# Patient Record
Sex: Female | Born: 1970 | Race: White | Hispanic: No | Marital: Married | Smoking: Never smoker
Health system: Southern US, Community
[De-identification: ages and names within clinical notes are randomized; demographics above are authoritative.]

## PROBLEM LIST (undated history)

## (undated) DIAGNOSIS — K589 Irritable bowel syndrome without diarrhea: Secondary | ICD-10-CM

## (undated) DIAGNOSIS — A6920 Lyme disease, unspecified: Secondary | ICD-10-CM

## (undated) HISTORY — DX: Irritable bowel syndrome, unspecified: K58.9

## (undated) HISTORY — DX: Lyme disease, unspecified: A69.20

## (undated) HISTORY — PX: BREAST BIOPSY: SHX20

## (undated) HISTORY — PX: FLEXIBLE SIGMOIDOSCOPY: SHX1649

---

## 2007-05-20 ENCOUNTER — Encounter: Admission: RE | Admit: 2007-05-20 | Discharge: 2007-05-20 | Payer: Self-pay | Admitting: Family Medicine

## 2007-06-30 ENCOUNTER — Ambulatory Visit (HOSPITAL_COMMUNITY): Admission: RE | Admit: 2007-06-30 | Discharge: 2007-06-30 | Payer: Self-pay | Admitting: Obstetrics and Gynecology

## 2007-07-08 ENCOUNTER — Ambulatory Visit (HOSPITAL_COMMUNITY): Admission: RE | Admit: 2007-07-08 | Discharge: 2007-07-08 | Payer: Self-pay | Admitting: Obstetrics and Gynecology

## 2007-07-22 ENCOUNTER — Ambulatory Visit (HOSPITAL_COMMUNITY): Admission: RE | Admit: 2007-07-22 | Discharge: 2007-07-22 | Payer: Self-pay | Admitting: Obstetrics and Gynecology

## 2007-08-19 ENCOUNTER — Ambulatory Visit (HOSPITAL_COMMUNITY): Admission: RE | Admit: 2007-08-19 | Discharge: 2007-08-19 | Payer: Self-pay | Admitting: Obstetrics and Gynecology

## 2007-08-30 ENCOUNTER — Encounter: Admission: RE | Admit: 2007-08-30 | Discharge: 2007-08-30 | Payer: Self-pay | Admitting: Family Medicine

## 2007-09-19 ENCOUNTER — Ambulatory Visit (HOSPITAL_COMMUNITY): Admission: RE | Admit: 2007-09-19 | Discharge: 2007-09-19 | Payer: Self-pay | Admitting: Obstetrics and Gynecology

## 2007-10-17 ENCOUNTER — Ambulatory Visit (HOSPITAL_COMMUNITY): Admission: RE | Admit: 2007-10-17 | Discharge: 2007-10-17 | Payer: Self-pay | Admitting: Obstetrics and Gynecology

## 2007-11-22 ENCOUNTER — Ambulatory Visit (HOSPITAL_COMMUNITY): Admission: RE | Admit: 2007-11-22 | Discharge: 2007-11-22 | Payer: Self-pay | Admitting: Family Medicine

## 2007-12-05 ENCOUNTER — Inpatient Hospital Stay (HOSPITAL_COMMUNITY): Admission: AD | Admit: 2007-12-05 | Discharge: 2007-12-05 | Payer: Self-pay | Admitting: Obstetrics and Gynecology

## 2007-12-06 ENCOUNTER — Inpatient Hospital Stay (HOSPITAL_COMMUNITY): Admission: AD | Admit: 2007-12-06 | Discharge: 2007-12-06 | Payer: Self-pay | Admitting: Obstetrics and Gynecology

## 2007-12-28 ENCOUNTER — Inpatient Hospital Stay (HOSPITAL_COMMUNITY): Admission: AD | Admit: 2007-12-28 | Discharge: 2007-12-30 | Payer: Self-pay | Admitting: Obstetrics & Gynecology

## 2009-02-26 ENCOUNTER — Encounter: Admission: RE | Admit: 2009-02-26 | Discharge: 2009-02-26 | Payer: Self-pay | Admitting: Family Medicine

## 2010-08-15 DIAGNOSIS — K9041 Non-celiac gluten sensitivity: Secondary | ICD-10-CM | POA: Insufficient documentation

## 2010-12-09 LAB — B. BURGDORFI ANTIBODIES: B burgdorferi Ab IgG+IgM: 0.13

## 2010-12-15 LAB — CBC
Hemoglobin: 11.3 — ABNORMAL LOW
MCV: 89.4
Platelets: 156
RDW: 15.2
WBC: 10.5

## 2010-12-15 LAB — RPR: RPR Ser Ql: NONREACTIVE

## 2011-03-23 DIAGNOSIS — Q7649 Other congenital malformations of spine, not associated with scoliosis: Secondary | ICD-10-CM | POA: Insufficient documentation

## 2012-11-16 ENCOUNTER — Other Ambulatory Visit: Payer: Self-pay

## 2012-11-16 DIAGNOSIS — Z1231 Encounter for screening mammogram for malignant neoplasm of breast: Secondary | ICD-10-CM

## 2012-12-07 ENCOUNTER — Ambulatory Visit
Admission: RE | Admit: 2012-12-07 | Discharge: 2012-12-07 | Disposition: A | Discharge status: Home / Self Care | Payer: Self-pay | Source: Ambulatory Visit

## 2012-12-07 DIAGNOSIS — Z1231 Encounter for screening mammogram for malignant neoplasm of breast: Secondary | ICD-10-CM

## 2012-12-13 DIAGNOSIS — K589 Irritable bowel syndrome without diarrhea: Secondary | ICD-10-CM | POA: Insufficient documentation

## 2013-09-29 DIAGNOSIS — IMO0002 Reserved for concepts with insufficient information to code with codable children: Secondary | ICD-10-CM | POA: Insufficient documentation

## 2013-10-12 DIAGNOSIS — N3941 Urge incontinence: Secondary | ICD-10-CM | POA: Insufficient documentation

## 2016-11-02 DIAGNOSIS — B351 Tinea unguium: Secondary | ICD-10-CM | POA: Insufficient documentation

## 2018-07-05 DIAGNOSIS — L603 Nail dystrophy: Secondary | ICD-10-CM | POA: Diagnosis not present

## 2018-07-05 DIAGNOSIS — L219 Seborrheic dermatitis, unspecified: Secondary | ICD-10-CM | POA: Diagnosis not present

## 2018-07-05 DIAGNOSIS — B351 Tinea unguium: Secondary | ICD-10-CM | POA: Diagnosis not present

## 2018-07-05 DIAGNOSIS — L03031 Cellulitis of right toe: Secondary | ICD-10-CM | POA: Diagnosis not present

## 2018-07-26 DIAGNOSIS — L603 Nail dystrophy: Secondary | ICD-10-CM | POA: Diagnosis not present

## 2018-08-29 DIAGNOSIS — L603 Nail dystrophy: Secondary | ICD-10-CM | POA: Diagnosis not present

## 2018-08-29 DIAGNOSIS — R740 Nonspecific elevation of levels of transaminase and lactic acid dehydrogenase [LDH]: Secondary | ICD-10-CM | POA: Diagnosis not present

## 2018-08-29 DIAGNOSIS — L219 Seborrheic dermatitis, unspecified: Secondary | ICD-10-CM | POA: Diagnosis not present

## 2018-09-26 DIAGNOSIS — L603 Nail dystrophy: Secondary | ICD-10-CM | POA: Diagnosis not present

## 2018-09-26 DIAGNOSIS — L03032 Cellulitis of left toe: Secondary | ICD-10-CM | POA: Diagnosis not present

## 2019-03-28 DIAGNOSIS — R300 Dysuria: Secondary | ICD-10-CM | POA: Diagnosis not present

## 2019-03-28 DIAGNOSIS — R350 Frequency of micturition: Secondary | ICD-10-CM | POA: Diagnosis not present

## 2019-06-01 ENCOUNTER — Other Ambulatory Visit: Payer: Self-pay

## 2019-06-01 ENCOUNTER — Telehealth: Payer: Self-pay | Admitting: Osteopathic Medicine

## 2019-06-01 ENCOUNTER — Ambulatory Visit (INDEPENDENT_AMBULATORY_CARE_PROVIDER_SITE_OTHER): Payer: BC Managed Care – PPO | Admitting: Osteopathic Medicine

## 2019-06-01 ENCOUNTER — Ambulatory Visit (INDEPENDENT_AMBULATORY_CARE_PROVIDER_SITE_OTHER): Payer: BC Managed Care – PPO

## 2019-06-01 ENCOUNTER — Encounter: Payer: Self-pay | Admitting: Osteopathic Medicine

## 2019-06-01 VITALS — BP 117/78 | HR 77 | Temp 98.2°F | Ht 64.0 in | Wt 170.1 lb

## 2019-06-01 DIAGNOSIS — Z8619 Personal history of other infectious and parasitic diseases: Secondary | ICD-10-CM

## 2019-06-01 DIAGNOSIS — Z01419 Encounter for gynecological examination (general) (routine) without abnormal findings: Secondary | ICD-10-CM | POA: Diagnosis not present

## 2019-06-01 DIAGNOSIS — G8929 Other chronic pain: Secondary | ICD-10-CM

## 2019-06-01 DIAGNOSIS — M25562 Pain in left knee: Secondary | ICD-10-CM

## 2019-06-01 DIAGNOSIS — L609 Nail disorder, unspecified: Secondary | ICD-10-CM

## 2019-06-01 DIAGNOSIS — M2550 Pain in unspecified joint: Secondary | ICD-10-CM

## 2019-06-01 DIAGNOSIS — Z0000 Encounter for general adult medical examination without abnormal findings: Secondary | ICD-10-CM

## 2019-06-01 NOTE — Telephone Encounter (Signed)
Lipids: Lipid screening Annual physical exam   A1C: Patient requested diagnostic testing (can cancel)  Vitamin D: Can cancel  CRP: Joint pain - should work for this? Not sure why not Can cancel

## 2019-06-01 NOTE — Patient Instructions (Addendum)
Plan: Labs today Will refer to OBGYN for well-woman care  Will refer to Infectious Disease for Lyme consultation Will refer to Sports Medicine for knee pain - will get Xray today. Can see Dr T in our office. I also gave information for Dr. Denyse Amass in [place].

## 2019-06-01 NOTE — Progress Notes (Signed)
Danielle Nelson is a 49 y.o. female who presents to  [facility]  today, 06/02/19, seeking care for the following: . New to establish - concern for persistent Lyme symptoms . Knee pain  . Nail issue on fingers/toes      ASSESSMENT & PLAN with other pertinent history/findings:  The primary encounter diagnosis was History of Lyme disease. Diagnoses of Chronic pain of left knee, Arthralgia, unspecified joint, Well woman exam, and Nail abnormalities were also pertinent to this visit.  1.  History of Lyme disease Per patient, long history of migrating joint pains and fatigue, was eventually diagnosed w/ Lyme around 2004 and reports treatment at that time for a month with likely Doxycycline. Concerned that various current symptoms may be Lyme related or may be something else. She reports migrating joint and lower back pains, urinary issues, nail issues.  Previous labs: 2012 RMSG IgM and IgG (-), Lyme IgM(-) and looks like some other Lyme titers cancelled, repeat Lyme Ab later 2012 (-), no other (+)results on file, records requested.   2. Chronic pain of left knee Referral to sports medicine Xray today Home exercises given Normal exam, she is concerned about a bump on the left knee but I cannot appreciate any obvious asymmetry on exam.   3. Arthralgia, unspecified joint Advised NSAID use as needed Labs as below to w/u other rheumatologic causes Previous labs 2012 (-)ANA, RA, CRP, ASO  4. Well woman exam Referral to OBGYN  5. Nail abnormalities Has seen dermatology and podiatry. Records reviewed. On my exam would agree that likely onychomycosis but she states she was tested for fungus and this was negative. Offered ciclopirox but patient declined. She reports history of infection, was diagnosed with paronychia last year and treated.  See photos below.   6. Thyroid abnormality On records, 2010 hyperthyroid, then hypothyroid, last labs  2017 euthyroid  TPO (-) 2013  7. Vitamin D deficiency Per previous labs as low as 15.5    Patient Instructions  Plan: Labs today Will refer to OBGYN for well-woman care  Will refer to Infectious Disease for Lyme consultation Will refer to Sports Medicine for knee pain - will get Xray today. Can see Dr T in our office. I also gave information for Dr. Georgina Snell in [place].     DG Knee Complete 4 Views Left  Result Date: 06/02/2019 CLINICAL DATA:  Left knee pain EXAM: LEFT KNEE - COMPLETE 4+ VIEW COMPARISON:  None. FINDINGS: No evidence of fracture, dislocation, or joint effusion. No evidence of arthropathy or other focal bone abnormality. Soft tissues are unremarkable. IMPRESSION: Negative. Electronically Signed   By: Misty Stanley M.D.   On: 06/02/2019 08:15     Orders Placed This Encounter  Procedures  . DG Knee Complete 4 Views Left  . Ambulatory referral to Sports Medicine  . Ambulatory referral to Obstetrics / Gynecology  . Ambulatory referral to Infectious Disease    No orders of the defined types were placed in this encounter.      Follow-up instructions: Return in about 2 weeks (around 06/15/2019) for RECHECK LYME AND OTHER ISSUES, GO OVER LABS .                                             BP 117/78 (BP Location: Left Arm, Patient Position: Sitting, Cuff Size: Normal)  Pulse 77   Temp 98.2 F (36.8 C) (Oral)   Ht 5\' 4"  (1.626 m)   Wt 170 lb 1.3 oz (77.1 kg)   LMP 04/29/2019   BMI 29.19 kg/m   No outpatient medications have been marked as taking for the 06/01/19 encounter (Office Visit) with 06/03/19, DO.    No results found for this or any previous visit (from the past 72 hour(s)).  DG Knee Complete 4 Views Left  Result Date: 06/02/2019 CLINICAL DATA:  Left knee pain EXAM: LEFT KNEE - COMPLETE 4+ VIEW COMPARISON:  None. FINDINGS: No evidence of fracture, dislocation, or joint effusion. No evidence of  arthropathy or other focal bone abnormality. Soft tissues are unremarkable. IMPRESSION: Negative. Electronically Signed   By: 06/04/2019 M.D.   On: 06/02/2019 08:15    Depression screen PHQ 2/9 06/01/2019  Decreased Interest 0  Down, Depressed, Hopeless 0  PHQ - 2 Score 0  Altered sleeping 2  Tired, decreased energy 2  Change in appetite 1  Feeling bad or failure about yourself  0  Trouble concentrating 0  Moving slowly or fidgety/restless 0  Suicidal thoughts 0  PHQ-9 Score 5  Difficult doing work/chores Somewhat difficult    GAD 7 : Generalized Anxiety Score 06/01/2019  Nervous, Anxious, on Edge 1  Control/stop worrying 0  Worry too much - different things 1  Trouble relaxing 0  Restless 0  Easily annoyed or irritable 1  Afraid - awful might happen 0  Total GAD 7 Score 3  Anxiety Difficulty Not difficult at all      All questions at time of visit were answered - patient instructed to contact office with any additional concerns or updates.  ER/RTC precautions were reviewed with the patient.  Please note: voice recognition software was used to produce this document, and typos may escape review. Please contact Dr. 06/03/2019 for any needed clarifications.   Total encounter time: 60 minutes.

## 2019-06-01 NOTE — Telephone Encounter (Signed)
Below labs are not covered. I checked in Care Everywhere and she does have a vitamin D Def and elevated LDL. I could not find an elevated glucose. Or a diagnosis for the CRP. Please advise.   Vitamin D Def E55.9 Elevated LDL E78.00  Lipid panel HgbA1C Vitamin D CRP

## 2019-06-01 NOTE — Telephone Encounter (Signed)
Pt was advised by [facility] that there were some labs that weren't covered by the Pts insurance. Can you please review these lab codes?  [id number]

## 2019-06-02 NOTE — Telephone Encounter (Signed)
Left message advising patient

## 2019-06-02 NOTE — Addendum Note (Signed)
Addended by: Chalmers Cater on: 06/02/2019 09:21 AM   Modules accepted: Orders

## 2019-06-02 NOTE — Telephone Encounter (Signed)
Reordered labs based on recommendations.

## 2019-06-09 DIAGNOSIS — Z8619 Personal history of other infectious and parasitic diseases: Secondary | ICD-10-CM | POA: Diagnosis not present

## 2019-06-09 DIAGNOSIS — G8929 Other chronic pain: Secondary | ICD-10-CM | POA: Diagnosis not present

## 2019-06-09 DIAGNOSIS — Z0000 Encounter for general adult medical examination without abnormal findings: Secondary | ICD-10-CM | POA: Diagnosis not present

## 2019-06-09 DIAGNOSIS — M25562 Pain in left knee: Secondary | ICD-10-CM | POA: Diagnosis not present

## 2019-06-12 LAB — RHEUMATOID FACTOR: Rheumatoid fact SerPl-aCnc: <14 IU/mL (ref <14)

## 2019-06-12 LAB — COMPLETE METABOLIC PANEL WITH GFR
AG Ratio: 1.8 (calc) (ref 1.0–2.5)
ALT: 109 U/L — ABNORMAL HIGH (ref 6–29)
AST: 44 U/L — ABNORMAL HIGH (ref 10–35)
Albumin: 4.5 g/dL (ref 3.6–5.1)
Alkaline phosphatase (APISO): 91 U/L (ref 31–125)
BUN: 10 mg/dL (ref 7–25)
CO2: 29 mmol/L (ref 20–32)
Calcium: 9.8 mg/dL (ref 8.6–10.2)
Chloride: 103 mmol/L (ref 98–110)
Creat: 0.66 mg/dL (ref 0.50–1.10)
GFR, Est African American: 123 mL/min/{1.73_m2} (ref >=60)
GFR, Est Non African American: 106 mL/min/{1.73_m2} (ref >=60)
Globulin: 2.5 g/dL (calc) (ref 1.9–3.7)
Glucose, Bld: 72 mg/dL (ref 65–99)
Potassium: 4.3 mmol/L (ref 3.5–5.3)
Sodium: 141 mmol/L (ref 135–146)
Total Bilirubin: 0.3 mg/dL (ref 0.2–1.2)
Total Protein: 7 g/dL (ref 6.1–8.1)

## 2019-06-12 LAB — CBC WITH DIFFERENTIAL/PLATELET
Absolute Monocytes: 440 cells/uL (ref 200–950)
Basophils Absolute: 28 cells/uL (ref 0–200)
Basophils Relative: 0.4 %
Eosinophils Absolute: 149 cells/uL (ref 15–500)
Eosinophils Relative: 2.1 %
HCT: 40.2 % (ref 35.0–45.0)
Hemoglobin: 13.2 g/dL (ref 11.7–15.5)
Lymphs Abs: 2286 cells/uL (ref 850–3900)
MCH: 28.6 pg (ref 27.0–33.0)
MCHC: 32.8 g/dL (ref 32.0–36.0)
MCV: 87.2 fL (ref 80.0–100.0)
MPV: 12.3 fL (ref 7.5–12.5)
Monocytes Relative: 6.2 %
Neutro Abs: 4196 cells/uL (ref 1500–7800)
Neutrophils Relative %: 59.1 %
Platelets: 228 10*3/uL (ref 140–400)
RBC: 4.61 10*6/uL (ref 3.80–5.10)
RDW: 14.4 % (ref 11.0–15.0)
Total Lymphocyte: 32.2 %
WBC: 7.1 10*3/uL (ref 3.8–10.8)

## 2019-06-12 LAB — SEDIMENTATION RATE: Sed Rate: 31 mm/h — ABNORMAL HIGH (ref 0–20)

## 2019-06-12 LAB — TSH: TSH: 1.21 mIU/L

## 2019-06-12 LAB — B. BURGDORFI ANTIBODIES: B burgdorferi Ab IgG+IgM: <0.9 index

## 2019-06-12 LAB — HIGH SENSITIVITY CRP: hs-CRP: >10 mg/L — ABNORMAL HIGH

## 2019-06-12 LAB — CK: Total CK: 74 U/L (ref 29–143)

## 2019-06-15 ENCOUNTER — Other Ambulatory Visit: Payer: Self-pay

## 2019-06-15 ENCOUNTER — Ambulatory Visit (INDEPENDENT_AMBULATORY_CARE_PROVIDER_SITE_OTHER): Payer: BC Managed Care – PPO

## 2019-06-15 ENCOUNTER — Ambulatory Visit (INDEPENDENT_AMBULATORY_CARE_PROVIDER_SITE_OTHER): Payer: BC Managed Care – PPO | Admitting: Osteopathic Medicine

## 2019-06-15 ENCOUNTER — Encounter: Payer: Self-pay | Admitting: Osteopathic Medicine

## 2019-06-15 VITALS — BP 120/79 | HR 76 | Temp 98.1°F | Wt 168.0 lb

## 2019-06-15 DIAGNOSIS — Z0000 Encounter for general adult medical examination without abnormal findings: Secondary | ICD-10-CM

## 2019-06-15 DIAGNOSIS — R748 Abnormal levels of other serum enzymes: Secondary | ICD-10-CM | POA: Diagnosis not present

## 2019-06-15 DIAGNOSIS — Z13220 Encounter for screening for lipoid disorders: Secondary | ICD-10-CM | POA: Diagnosis not present

## 2019-06-15 DIAGNOSIS — Z1211 Encounter for screening for malignant neoplasm of colon: Secondary | ICD-10-CM | POA: Diagnosis not present

## 2019-06-15 DIAGNOSIS — K7689 Other specified diseases of liver: Secondary | ICD-10-CM | POA: Diagnosis not present

## 2019-06-15 NOTE — Patient Instructions (Signed)
General Preventive Care  Most recent routine screening labs: need lipids (cholesterol)   Everyone should have blood pressure checked once per year. Goal 130/80 or less.   Tobacco: don't!   Alcohol: responsible moderation is ok for most adults - if you have concerns about your alcohol intake, please talk to me!   Exercise: as tolerated to reduce risk of cardiovascular disease and diabetes. Strength training will also prevent osteoporosis.   Mental health: if need for mental health care (medicines, counseling, other), or concerns about moods, please let me know!   Sexual health: if need for STD testing, or if concerns with libido/pain problems, please let me know! If you need to discuss your birth control options, please let me know!   Advanced Directive: Living Will and/or Healthcare Power of Attorney recommended for all adults, regardless of age or health.  Vaccines  Flu vaccine: for almost everyone, every fall.   Shingles vaccine: after age 5.   Pneumonia vaccines: after age 70  Tetanus booster: every 10 years  COVID vaccine: thanks for taking the vaccine!  Cancer screenings   Colon cancer screening: for everyone age 73-75. Colonoscopy available for all, many people also qualify for the Cologuard stool test   Breast cancer screening: mammogram at age 60 every other year at least, and annually after age 49.   Cervical cancer screening: Pap every 1 to 5 years depending on age and other risk factors. Can usually stop at age 44 or w/ hysterectomy.   Lung cancer screening: not needed for non-smokers  Infection screenings  . HIV: recommended screening at least once age 71-65 . Gonorrhea/Chlamydia: screening as needed . Hepatitis C: recommended once for everyone age 48-75 . TB: certain at-risk populations, or depending on work requirements and/or travel history Other . Bone Density Test: recommended for women at age 101

## 2019-06-15 NOTE — Progress Notes (Signed)
HPI: Danielle Nelson is a 49 y.o. female who  has a past medical history of Lyme disease.  she presents to [facility] today, 06/16/19,  for chief complaint of: Annual Physical Review labs - transaminitis   Upcoming appts w/ GYN, ID, Sports med Continued migrating joint pains  Trnasaminitis, most recent previous records 2017 show normal AST/ALT    Past medical, surgical, social and family history reviewed:  There are no problems to display for this patient.   No past surgical history on file.  Social History   Tobacco Use  . Smoking status: Never Smoker  . Smokeless tobacco: Never Used  Substance Use Topics  . Alcohol use: Not Currently    Family History  Problem Relation Age of Onset  . High blood pressure Mother   . Parkinson's disease Father   . Stroke Maternal Grandmother      Current medication list and allergy/intolerance information reviewed:    No current outpatient medications on file.   No current facility-administered medications for this visit.    No Known Allergies    Review of Systems:  Constitutional:  No  fever, no chills  HEENT: No  headache, no vision change  Cardiac: No  chest pain, No  pressure  Respiratory:  No  shortness of breath. No  Cough  Gastrointestinal: No  abdominal pain  Musculoskeletal: + myalgia/arthralgia  Skin: No  Rash, No other wounds/concerning lesions  Neurologic: No  weakness, No  dizziness  Psychiatric: No  concerns with depression, No  concerns with anxiety  Exam:  BP 120/79 (BP Location: Left Arm, Patient Position: Sitting, Cuff Size: Normal)   Pulse 76   Temp 98.1 F (36.7 C) (Oral)   Wt 168 lb 0.6 oz (76.2 kg)   BMI 28.84 kg/m   Constitutional: VS see above. General Appearance: alert, well-developed, well-nourished, NAD  Eyes: Normal lids and conjunctive, non-icteric sclera  Neck: No masses, trachea midline  Respiratory: Normal respiratory effort. no wheeze,  no rhonchi, no rales  Cardiovascular: S1/S2 normal, no murmur, no rub/gallop auscultated. RRR.   Musculoskeletal: Gait normal. No clubbing/cyanosis of digits.   Neurological: Normal balance/coordination. No tremor.  Skin: warm, dry, intact. No rash/ulcer. N  Psychiatric: Normal judgment/insight. Normal mood and affect. Oriented x3.         ASSESSMENT/PLAN: The primary encounter diagnosis was Annual physical exam. Diagnoses of Elevated liver enzymes, Lipid screening, and Colon cancer screening were also pertinent to this visit.   Orders Placed This Encounter  Procedures  . US ABDOMEN LIMITED RUQ  . Lipid panel  . Ferritin  . Gamma GT  . Hepatitis B surface antigen  . Hepatitis C antibody  . Iron and TIBC  . CP4508-PT/INR AND PTT  . Hepatic function panel    No orders of the defined types were placed in this encounter.   Patient Instructions  General Preventive Care  Most recent routine screening labs: need lipids (cholesterol)   Everyone should have blood pressure checked once per year. Goal 130/80 or less.   Tobacco: don't!   Alcohol: responsible moderation is ok for most adults - if you have concerns about your alcohol intake, please talk to me!   Exercise: as tolerated to reduce risk of cardiovascular disease and diabetes. Strength training will also prevent osteoporosis.   Mental health: if need for mental health care (medicines, counseling, other), or concerns about moods, please let me know!   Sexual health: if need for STD testing, or if  concerns with libido/pain problems, please let me know! If you need to discuss your birth control options, please let me know!   Advanced Directive: Living Will and/or Healthcare Power of Attorney recommended for all adults, regardless of age or health.  Vaccines  Flu vaccine: for almost everyone, every fall.   Shingles vaccine: after age 22.   Pneumonia vaccines: after age 64  Tetanus booster: every 10  years  COVID vaccine: thanks for taking the vaccine!  Cancer screenings   Colon cancer screening: for everyone age 51-75. Colonoscopy available for all, many people also qualify for the Cologuard stool test   Breast cancer screening: mammogram at age 76 every other year at least, and annually after age 62.   Cervical cancer screening: Pap every 1 to 5 years depending on age and other risk factors. Can usually stop at age 48 or w/ hysterectomy.   Lung cancer screening: not needed for non-smokers  Infection screenings  . HIV: recommended screening at least once age 26-65 . Gonorrhea/Chlamydia: screening as needed . Hepatitis C: recommended once for everyone age 79-75 . TB: certain at-risk populations, or depending on work requirements and/or travel history Other . Bone Density Test: recommended for women at age 57       Visit summary with medication list and pertinent instructions was printed for patient to review. All questions at time of visit were answered - patient instructed to contact office with any additional concerns or updates. ER/RTC precautions were reviewed with the patient.     Please note: voice recognition software was used to produce this document, and typos may escape review. Please contact Dr. Lyn Hollingshead for any needed clarifications.     Follow-up plan: Return in about 1 year (around 06/14/2020) for [place] (call week prior to visit for lab orders) / sooner as needed, depending on results .

## 2019-06-16 LAB — LIPID PANEL
Cholesterol: 238 mg/dL — ABNORMAL HIGH (ref <200)
HDL: 47 mg/dL — ABNORMAL LOW (ref >=50)
LDL Cholesterol (Calc): 146 mg/dL (calc) — ABNORMAL HIGH
Non-HDL Cholesterol (Calc): 191 mg/dL (calc) — ABNORMAL HIGH (ref <130)
Total CHOL/HDL Ratio: 5.1 (calc) — ABNORMAL HIGH (ref <5.0)
Triglycerides: 294 mg/dL — ABNORMAL HIGH (ref <150)

## 2019-06-16 LAB — HEPATIC FUNCTION PANEL
AG Ratio: 1.7 (calc) (ref 1.0–2.5)
ALT: 99 U/L — ABNORMAL HIGH (ref 6–29)
AST: 51 U/L — ABNORMAL HIGH (ref 10–35)
Albumin: 4.3 g/dL (ref 3.6–5.1)
Alkaline phosphatase (APISO): 84 U/L (ref 31–125)
Bilirubin, Direct: 0.1 mg/dL (ref 0.0–0.2)
Globulin: 2.6 g/dL (calc) (ref 1.9–3.7)
Indirect Bilirubin: 0.3 mg/dL (calc) (ref 0.2–1.2)
Total Bilirubin: 0.4 mg/dL (ref 0.2–1.2)
Total Protein: 6.9 g/dL (ref 6.1–8.1)

## 2019-06-16 LAB — CP4508-PT/INR AND PTT
INR: 1
Prothrombin Time: 10.2 s (ref 9.0–11.5)
aPTT: 23 s (ref 23–32)

## 2019-06-16 LAB — HEPATITIS B SURFACE ANTIGEN: Hepatitis B Surface Ag: NONREACTIVE

## 2019-06-16 LAB — FERRITIN: Ferritin: 46 ng/mL (ref 16–232)

## 2019-06-16 LAB — HEPATITIS C ANTIBODY
Hepatitis C Ab: NONREACTIVE
SIGNAL TO CUT-OFF: 0.01 (ref <1.00)

## 2019-06-16 LAB — GAMMA GT: GGT: 120 U/L — ABNORMAL HIGH (ref 3–55)

## 2019-06-19 ENCOUNTER — Ambulatory Visit: Payer: BC Managed Care – PPO | Admitting: Family Medicine

## 2019-06-19 ENCOUNTER — Other Ambulatory Visit: Payer: Self-pay

## 2019-06-19 ENCOUNTER — Ambulatory Visit (INDEPENDENT_AMBULATORY_CARE_PROVIDER_SITE_OTHER): Payer: BC Managed Care – PPO

## 2019-06-19 ENCOUNTER — Encounter: Payer: Self-pay | Admitting: Family Medicine

## 2019-06-19 VITALS — BP 120/84 | HR 100 | Ht 64.0 in | Wt 165.0 lb

## 2019-06-19 DIAGNOSIS — M25562 Pain in left knee: Secondary | ICD-10-CM

## 2019-06-19 DIAGNOSIS — Z8619 Personal history of other infectious and parasitic diseases: Secondary | ICD-10-CM | POA: Insufficient documentation

## 2019-06-19 NOTE — Progress Notes (Signed)
Subjective:   I, Danielle Nelson, am serving as a scribe for Dr. Lynne Leader.  I'm seeing this patient as a consultation for: Danielle Reeve, DO . Note will be routed back to referring provider/PCP.  CC: Chronic pain of L knee  HPI: Patient is a 49 year old female presenting with chronic pain of L knee X 4 month . She rates pain as 3-4/10 and describes pain as throbbing in nature. Patient locates pain to lateral knee . Patient had an X-ray 06/01/2019 that did not show anything. 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  Pain is mostly anterior knee.  She does note a little bump on the superior anterior-lateral aspect of the knee.  She has this is somewhat painful.  Radiating pain: no L LE numbness/tingling: no L LE weakness: no Aggravating factors: walking or overuse  Treatments tried: given home exercises motrin, ice  Past medical history, Surgical history, Family history, Social history, Allergies, and medications have been entered into the medical record, reviewed.   Review of Systems: No new headache, visual changes, nausea, vomiting, diarrhea, constipation, dizziness, abdominal pain, skin rash, fevers, chills, night sweats, weight loss, swollen lymph nodes, body aches, joint swelling, muscle aches, chest pain, shortness of breath, mood changes, visual or auditory hallucinations.   Objective:    Vitals:   06/19/19 1320  BP: 120/84  Pulse: 100  SpO2: 99%   General: Well Developed, well nourished, and in no acute distress.  Neuro/Psych: Alert and oriented x3, extra-ocular muscles intact, able to move all 4 extremities, sensation grossly intact. Skin: Warm and dry, no rashes noted.  Respiratory: Not using accessory muscles, speaking in full sentences, trachea midline.  Cardiovascular: Pulses palpable, no extremity edema. Abdomen: Does not appear distended. MSK: Left knee: Small bony prominence superior lateral patella.  Minimally tender.     Decreased VMO bulk.  Otherwise normal-appearing Range of motion 0-120 degrees without significant crepitation. Tender palpation mildly medial and lateral knee and it bony prominence as above. Stable ligamentous exam. Intact strength. Negative McMurray's test.  Lab and Radiology Results  EXAM: LEFT KNEE - COMPLETE 4+ VIEW  COMPARISON:  None.  FINDINGS: No evidence of fracture, dislocation, or joint effusion. No evidence of arthropathy or other focal bone abnormality. Soft tissues are unremarkable.  IMPRESSION: Negative.   Electronically Signed   By: Misty Stanley M.D.   On: 06/02/2019 08:15  I, Lynne Leader, personally (independently) visualized and performed the interpretation of the images attached in this note.  Diagnostic Limited MSK Ultrasound of: Left knee Quad tendon intact normal-appearing Lateral aspect of quad tendon insertion of bony prominence appears to be spur formation on ultrasound.  No increased vascular activity in this area. Patellar tendon margin normal-appearing slight hypoechoic fluid tracking both superficial and deep to patellar tendon distally. Medial lateral joint line largely normal-appearing slight degenerative appearance No Baker's cyst Impression: Slight prepatellar bursitis.  Spur formation at quad tendon insertion superior lateral patella   Impression and Recommendations:    Assessment and Plan: 49 y.o. female with left knee pain.  Patellofemoral pain and mild prepatellar bursitis most likely explanation for pain.  Plan for trial of physical therapy as well as Voltaren gel.  Recheck back in about 6 weeks.  Return sooner if needed.  Precautions reviewed.  PDMP not reviewed this encounter. Orders Placed This Encounter  Procedures  . [place] LIMITED JOINT SPACE STRUCTURES LOW LEFT    Standing Status:   Future  Number of Occurrences:   1    Standing Expiration Date:   08/18/2020    Order Specific Question:   Reason for Exam (SYMPTOM  OR  DIAGNOSIS REQUIRED)    Answer:   Left knee pain    Order Specific Question:   Preferred imaging location?    Answer:   Adult nurse [facility]  . Ambulatory referral to Physical Therapy    Referral Priority:   Routine    Referral Type:   Physical Medicine    Referral Reason:   Specialty Services Required    Requested Specialty:   Physical Therapy   No orders of the defined types were placed in this encounter.   Discussed warning signs or symptoms. Please see discharge instructions. Patient expresses understanding.   The above documentation has been reviewed and is accurate and complete Clementeen Graham

## 2019-06-19 NOTE — Patient Instructions (Signed)
Thank you for coming in today.  Attend PT.  Use over the counter voltaren gel up to 4x daily.  Recheck with me in 6 weeks or sooner if needed.  If not doing well please let me know sooner.   Patellofemoral Pain Syndrome  Patellofemoral pain syndrome is a condition in which the tissue (cartilage) on the underside of the kneecap (patella) softens or breaks down. This causes pain in the front of the knee. The condition is also called runner's knee or chondromalacia patella. Patellofemoral pain syndrome is most common in young adults who are active in sports. The knee is the largest joint in the body. The patella covers the front of the knee and is attached to muscles above and below the knee. The underside of the patella is covered with a smooth type of cartilage (synovium). The smooth surface helps the patella to glide easily when you move your knee. Patellofemoral pain syndrome causes swelling in the joint linings and bone surfaces in the knee. What are the causes? This condition may be caused by:  Overuse of the knee.  Poor alignment of your knee joints.  Weak leg muscles.  A direct blow to your kneecap. What increases the risk? You are more likely to develop this condition if:  You do a lot of activities that can wear down your kneecap. These include: ? Running. ? Squatting. ? Climbing stairs.  You start a new physical activity or exercise program.  You wear shoes that do not fit well.  You do not have good leg strength.  You are overweight. What are the signs or symptoms? The main symptom of this condition is knee pain. This may feel like a dull, aching pain underneath your patella, in the front of your knee. There may be a popping or cracking sound when you move your knee. Pain may get worse with:  Exercise.  Climbing stairs.  Running.  Jumping.  Squatting.  Kneeling.  Sitting for a long time.  Moving or pushing on your patella. How is this diagnosed? This  condition may be diagnosed based on:  Your symptoms and medical history. You may be asked about your recent physical activities and which ones cause knee pain.  A physical exam. This may include: ? Moving your patella back and forth. ? Checking your range of knee motion. ? Having you squat or jump to see if you have pain. ? Checking the strength of your leg muscles.  Imaging tests to confirm the diagnosis. These may include an MRI of your knee. How is this treated? This condition may be treated at home with rest, ice, compression, and elevation (RICE).  Other treatments may include:  Nonsteroidal anti-inflammatory drugs (NSAIDs).  Physical therapy to stretch and strengthen your leg muscles.  Shoe inserts (orthotics) to take stress off your knee.  A knee brace or knee support.  Adhesive tapes to the skin.  Surgery to remove damaged cartilage or move the patella to a better position. This is rare. Follow these instructions at home: If you have a shoe or brace:  Wear the shoe or brace as told by your health care provider. Remove it only as told by your health care provider.  Loosen the shoe or brace if your toes tingle, become numb, or turn cold and blue.  Keep the shoe or brace clean.  If the shoe or brace is not waterproof: ? Do not let it get wet. ? Cover it with a watertight covering when you take a  bath or a shower. Managing pain, stiffness, and swelling  If directed, put ice on the painful area. ? If you have a removable shoe or brace, remove it as told by your health care provider. ? Put ice in a plastic bag. ? Place a towel between your skin and the bag. ? Leave the ice on for 20 minutes, 2-3 times a day.  Move your toes often to avoid stiffness and to lessen swelling.  Rest your knee: ? Avoid activities that cause knee pain. ? When sitting or lying down, raise (elevate) the injured area above the level of your heart, whenever possible. General  instructions  Take over-the-counter and prescription medicines only as told by your health care provider.  Use splints, braces, knee supports, or walking aids as directed by your health care provider.  Perform stretching and strengthening exercises as told by your health care provider or physical therapist.  Do not use any products that contain nicotine or tobacco, such as cigarettes and e-cigarettes. These can delay healing. If you need help quitting, ask your health care provider.  Return to your normal activities as told by your health care provider. Ask your health care provider what activities are safe for you.  Keep all follow-up visits as told by your health care provider. This is important. Contact a health care provider if:  Your symptoms get worse.  You are not improving with home care. Summary  Patellofemoral pain syndrome is a condition in which the tissue (cartilage) on the underside of the kneecap (patella) softens or breaks down.  This condition causes swelling in the joint linings and bone surfaces in the knee. This leads to pain in the front of the knee.  This condition may be treated at home with rest, ice, compression, and elevation (RICE).  Use splints, braces, knee supports, or walking aids as directed by your health care provider. This information is not intended to replace advice given to you by your health care provider. Make sure you discuss any questions you have with your health care provider. Document Revised: 04/12/2017 Document Reviewed: 04/12/2017 Elsevier Patient Education  2020 Elsevier Inc.    Prepatellar Bursitis  Prepatellar bursitis is inflammation of the prepatellar bursa, which is a fluid-filled sac that cushions the kneecap (patella). Prepatellar bursitis happens when fluid builds up in this sac and causes it to swell. The condition causes knee pain. What are the causes? This condition may be caused by:  Constant pressure on the knees  from kneeling.  A hit to the knee.  Falling on the knee.  Infection from bacteria.  Moving the knee often in a forceful way. What increases the risk? You are more likely to develop this condition if:  You play sports that have a high risk of falling on the knee or being hit on the knee. These include football, wrestling, basketball, or soccer.  You do work in which you kneel for long periods of time, such as roofing, plumbing, or gardening.  You have another inflammatory condition, such as gout or rheumatoid arthritis. What are the signs or symptoms? The most common symptom of this condition is knee pain that gets better with rest. Other symptoms include:  Swelling on the front of the kneecap.  Warmth in the knee.  Tenderness with activity.  Redness in the knee.  Inability to bend the knee or to kneel. How is this diagnosed? This condition is diagnosed based on:  A physical exam. Your health care provider will compare your  knees and check for tenderness and pain while moving your knee.  Your medical history.  Tests to check for infection. These may include blood tests and tests on the fluid in the bursa.  Imaging tests, such as X-ray, MRI, or ultrasound, to check for damage in the patella, or fluid buildup and swelling in the bursa. How is this treated? This condition may be treated by:  Resting the knee.  Putting ice on the knee.  Taking medicines, such as: ? NSAIDs. These can help to reduce pain and swelling. ? Antibiotics. These may be needed if you have an infection. ? Steroids. These are used to reduce swelling and inflammation, and may be prescribed if other treatments are not helping.  Raising (elevating) the knee while resting.  Doing exercises to help you maintain movement (physical therapy). These may be recommended after pain and swelling improve.  Having a procedure to remove fluid from the bursa. This may be done if other treatments are not  helping.  Having surgery to remove the bursa. This may be done if you have a severe infection or if the condition keeps coming back after treatment. Follow these instructions at home: Medicines  Take over-the-counter and prescription medicines only as told by your health care provider.  If you were prescribed an antibiotic medicine, take it as told by your health care provider. Do not stop taking the antibiotic even if you start to feel better. Managing pain, stiffness, and swelling   If directed, put ice on the injured area. ? Put ice in a plastic bag. ? Place a towel between your skin and the bag. ? Leave the ice on for 20 minutes, 2-3 times a day.  Elevate the injured area above the level of your heart while you are sitting or lying down. Activity  Do not use the injured limb to support your body weight until your health care provider says that you can.  Rest your knee.  Avoid activities that cause knee pain.  Return to your normal activities as told by your health care provider. Ask your health care provider what activities are safe for you.  Do exercises as told by your health care provider. General instructions  Ask your health care provider when it is safe for you to drive.  Do not use any products that contain nicotine or tobacco, such as cigarettes, e-cigarettes, and chewing tobacco. These can delay healing. If you need help quitting, ask your health care provider.  Keep all follow-up visits as told by your health care provider. This is important. How is this prevented?  Warm up and stretch before being active.  Cool down and stretch after being active.  Give your body time to rest between periods of activity.  Maintain physical fitness, including strength and flexibility.  Be safe and responsible while being active. This will help you to avoid falls.  Wear knee pads if you have to kneel for a long period of time. Contact a health care provider if:  Your  symptoms do not improve or get worse.  Your symptoms keep coming back after treatment.  You develop a fever and have warmth, redness, or swelling over your knee. Summary  Prepatellar bursitis is inflammation of the prepatellar bursa, which is a fluid-filled sac that cushions the kneecap (patella).  This condition may be caused by injury or constant pressure on the knee. It may also be caused by an infection from bacteria.  Symptoms of this condition include pain, swelling, warmth, and  tenderness in the knee.  Follow instructions from your health care provider about taking medicines, resting, and doing activities.  Contact your health care provider if your symptoms do not improve, get worse, or keep coming back after treatment. This information is not intended to replace advice given to you by your health care provider. Make sure you discuss any questions you have with your health care provider. Document Revised: 06/24/2018 Document Reviewed: 05/12/2018 Elsevier Patient Education  2020 [employer].

## 2019-06-20 ENCOUNTER — Ambulatory Visit: Payer: BC Managed Care – PPO | Admitting: Internal Medicine

## 2019-06-20 ENCOUNTER — Other Ambulatory Visit: Payer: Self-pay

## 2019-06-20 ENCOUNTER — Encounter: Payer: Self-pay | Admitting: Internal Medicine

## 2019-06-20 DIAGNOSIS — M2550 Pain in unspecified joint: Secondary | ICD-10-CM | POA: Diagnosis not present

## 2019-06-20 NOTE — Addendum Note (Signed)
Addended by: Deirdre Pippins on: 06/20/2019 04:57 PM   Modules accepted: Orders

## 2019-06-20 NOTE — Progress Notes (Signed)
[facility]      Reason for Consult: myalgias and arthralgias    Referring Physician: Dr. Lyn Hollingshead    Patient ID: Danielle Nelson, female    DOB: 1970-05-08, 49 y.o.   MRN: [id number]  HPI:   She comes here for evaluation of chronic arthralgias, myalgias and gastrointestinal symptoms that she has had since her initial diagnosis of Lyme disease in about 2003.  She was living in the [place] area and did have some tick bites including 1 erythema migrans rash and subsequently developed some symptoms including some left arm paresthesias and after approximately 1 year of symptoms, was diagnosed with serologic positive Lyme disease.  Records of that are unavailable but she was treated initially with 2 weeks of doxycycline then had a longer course with another 2 weeks.  She also in 2008 had serologic tests regarding Lyme and other tick related coinfections including Babesia, ehrlichiosis and a repeat Lyme disease tests and all tests were were negative except for a minimally positive IgG of ehrlichiosis.  She does not remember any issues with leukopenia or thrombocytopenia that were noted.  Recent labs also done by her primary care physician show negative Lyme disease titer and other work-up including rheumatologic diseases all are within normal limits.  She has been living with her symptoms for years but wanted to reestablish with physicians including infectious diseases to see if there is any new insight into post Lyme syndrome or what is otherwise called chronic Lyme disease. Previous record reviewed and negative recent Lyme and previous negative tests from 2008.    Past Medical History:  Diagnosis Date  . Lyme disease     Prior to Admission medications   Medication Sig Start Date End Date Taking? Authorizing Provider  Multiple Vitamin (MULTI-VITAMIN) tablet Take by mouth.   Yes [provider]  ibuprofen (ADVIL) 200 MG tablet Take by mouth.    [provider]    No Known Allergies  Social History   Tobacco Use  . Smoking status: Never Smoker  . Smokeless tobacco: Never Used  Substance Use Topics  . Alcohol use: Not Currently  . Drug use: Never    Family History  Problem Relation Age of Onset  . High blood pressure Mother   . Parkinson's disease Father   . Stroke Maternal Grandmother     Review of Systems  Constitutional: negative for fevers, chills, anorexia and weight loss Gastrointestinal: positive for diarrhea and bloating, negative for nausea Hematologic/lymphatic: negative for lymphadenopathy Musculoskeletal: positive for myalgias and arthralgias All other systems reviewed and are negative    Constitutional: in no apparent distress  Vitals:   06/20/19 0914  BP: 133/85  Pulse: 84  Temp: 98.5 F (36.9 C)  SpO2: 98%   EYES: anicteric Musculoskeletal: no pedal edema noted, no joint swelling Skin: negatives: no rash Neuro: non-focal  Labs: Lab Results  Component Value Date   WBC 7.1 06/09/2019   HGB 13.2 06/09/2019   HCT 40.2 06/09/2019   MCV 87.2 06/09/2019   PLT 228 06/09/2019    Lab Results  Component Value Date   CREATININE 0.66 06/09/2019   BUN 10 06/09/2019   NA 141 06/09/2019   K 4.3 06/09/2019   CL 103 06/09/2019   CO2 29 06/09/2019    Lab Results  Component Value Date   ALT 99 (H) 06/15/2019   AST 51 (H) 06/15/2019   BILITOT 0.4 06/15/2019   INR 1.0 06/15/2019  Assessment: Post Lyme syndrome.  She gives a history of Lyme disease in the [place] area where there is some endemicity with a clear erythema migrans rash following a tick bite in the appropriate season.  She also endorses positive serology though, those results are not available to me.  This seems consistent with a history of Lyme disease and she did receive appropriate treatment.  I discussed with her what is known of a post Lyme syndrome which is essentially no known treatment.  What we do know is there is no  role known at this time for long-term antibiotics or other known treatments.  I did mention ongoing studies going on with [place] in [place] though I did not give her any particular information regarding this.  There are research studies looking at cognition as well as investigation of medications but nothing that has been approved.  Therefore from an infectious disease standpoint, there is no particular options known at this time.  She could reach out to [employer] ([place]) for further information to see if she would be appropriate for any study.  Plan: 1) supportive care

## 2019-06-21 ENCOUNTER — Encounter: Payer: Self-pay | Admitting: Gastroenterology

## 2019-06-22 ENCOUNTER — Ambulatory Visit: Payer: BC Managed Care – PPO | Admitting: Nurse Practitioner

## 2019-07-03 ENCOUNTER — Encounter: Payer: BC Managed Care – PPO | Admitting: Obstetrics & Gynecology

## 2019-07-04 ENCOUNTER — Encounter: Payer: Self-pay | Admitting: Rehabilitative and Restorative Service Providers"

## 2019-07-04 ENCOUNTER — Ambulatory Visit (INDEPENDENT_AMBULATORY_CARE_PROVIDER_SITE_OTHER): Payer: BC Managed Care – PPO | Admitting: Rehabilitative and Restorative Service Providers"

## 2019-07-04 ENCOUNTER — Other Ambulatory Visit: Payer: Self-pay

## 2019-07-04 DIAGNOSIS — M25562 Pain in left knee: Secondary | ICD-10-CM | POA: Diagnosis not present

## 2019-07-04 DIAGNOSIS — M6281 Muscle weakness (generalized): Secondary | ICD-10-CM | POA: Diagnosis not present

## 2019-07-04 DIAGNOSIS — G8929 Other chronic pain: Secondary | ICD-10-CM

## 2019-07-04 NOTE — Patient Instructions (Signed)
Access Code: [email or web address]: [email or web address]: 04/20/2021Prepared by: Karston Hyland HoltExercises  Hooklying Hamstring Stretch with Strap - 2 x daily - 7 x weekly - 3 reps - 1 sets - 30 sec hold  Prone Quadriceps Stretch with Strap - 2 x daily - 7 x weekly - 1 sets - 3 reps - 30 sec hold Patient Education  Kinesiology tape

## 2019-07-04 NOTE — Therapy (Signed)
[facility] [place] [address] [place], [place], [address] Phone: [phone]   Fax:  [phone]  Physical Therapy Evaluation  Patient Details  Name: Danielle Nelson MRN: [id number] Date of Birth: 1970/11/05 Referring Provider (PT): Dr Clementeen Graham     Encounter Date: 07/04/2019  PT End of Session - 07/04/19 1055    Visit Number  1    Number of Visits  12    Date for PT Re-Evaluation  08/15/19    PT Start Time  0811   patient late for appointment   PT Stop Time  0848    PT Time Calculation (min)  37 min    Activity Tolerance  Patient tolerated treatment well       Past Medical History:  Diagnosis Date  . Lyme disease     History reviewed. No pertinent surgical history.  There were no vitals filed for this visit.   Subjective Assessment - 07/04/19 0811    Subjective  Patient reports that she had Lt knee pain starting 18 years ago and was diagnosed with "shifted knee cap" which was treated conservatively. She has had some intermittent pain lasting a couple of days and resolved. In the past few months the pain has increased with walking.    Pertinent History  multiple joint pain following Lyme's disease 2003 - continues to have constant joint pain    Patient Stated Goals  get rid of pain in the knee    Currently in Pain?  Yes    Pain Score  1     Pain Location  Knee    Pain Orientation  Left    Pain Descriptors / Indicators  Aching    Pain Type  Chronic pain    Pain Radiating Towards  into calf and lateral leg    Pain Onset  More than a month ago    Pain Frequency  Intermittent    Aggravating Factors   walking; stairs; ADL's    Pain Relieving Factors  avoiding activities that irritate knee; ice; elevation; motrin         [facility] PT Assessment - 07/04/19 0001      Assessment   Medical Diagnosis  Lt knee pain; patellofemoral pain     Referring Provider (PT)  Dr Clementeen Graham      Onset Date/Surgical Date  01/15/20     Hand Dominance  Right    Next MD Visit  PRN     Prior Therapy  18 years ago       Precautions   Precautions  None      Balance Screen   Has the patient fallen in the past 6 months  No    Has the patient had a decrease in activity level because of a fear of falling?   No    Is the patient reluctant to leave their home because of a fear of falling?   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 chores; school for children; volunteering at church; PTA       Observation/Other Assessments   Observations  girth Lt LE decreased compared to Rt at thigh     Focus on Therapeutic Outcomes (FOTO)   56% limitation       Sensation   Additional Comments  WFL's per pt report  Posture/Postural Control   Posture Comments  wearing ~ 2 inch heels       AROM   Right/Left Hip  --   ROM equal and WFL's bilat    Right/Left Knee  --   ROM equal and WFL's bilat      Strength   Overall Strength Comments  strength is functional bilat LE''s    Right/Left Hip  --   not tested resistively    Right Knee Flexion  5/5    Right Knee Extension  5/5    Left Knee Flexion  5/5    Left Knee Extension  5/5    Right/Left Ankle  --   WFL's bilat      Flexibility   Hamstrings  tight Lt > Rt     Quadriceps  tight Lt > Rt    ITB  WFL's     Piriformis  tight Lt > Rt       Palpation   Patella mobility  decreased medial glide Lt patella     Palpation comment  Lt LE - tightness and banding lateral quad at insertion to patella; tenderness lateral quad and calf, anterior tib       Balance   Balance Assessed  --   SLS ~ 10 sec each LE - more difficult Lt compared to Rt                Objective measurements completed on examination: See above findings.      [facility] Adult PT Treatment/Exercise - 07/04/19 0001      Knee/Hip Exercises: Stretches   Passive Hamstring Stretch  Left;2  reps;30 seconds   supine with strap    Quad Stretch  Left;2 reps;30 seconds   prone with strap      Manual Therapy   Kinesiotex  --   patellofemoral correction             PT Education - 07/04/19 0848    Education Details  HEP taping    Person(s) Educated  Patient    Methods  Explanation;Demonstration;Tactile cues;Verbal cues;Handout    Comprehension  Verbalized understanding;Returned demonstration;Verbal cues required;Tactile cues required          PT Long Term Goals - 07/04/19 1103      PT LONG TERM GOAL #1   Title  Increase LE functional strength with patient to report ability and stand and walk for 15-20 min with minimal to no increase in pain.    Time  6    Period  Weeks    Status  New    Target Date  08/15/19      PT LONG TERM GOAL #2   Title  Decrease pain by 50-75% allowing patient to progress with ADL's    Time  6    Period  Weeks    Status  New    Target Date  08/15/19      PT LONG TERM GOAL #3   Title  Patient to demonstrate increased tissue extensibility in hamstrings and piriformis musculature with mobility equal bilat    Time  6    Period  Weeks    Status  New    Target Date  08/15/19      PT LONG TERM GOAL #4   Title  Independent in HEP    Time  6    Period  Weeks    Status  New    Target Date  08/15/19      PT LONG TERM  GOAL #5   Title  Improve FOTO to </= 37% limitation    Time  6    Period  Weeks    Status  New    Target Date  08/15/19             Plan - 07/04/19 1056    Clinical Impression Statement  Patient presents with recurrent Lt knee pain over the past 18 years with most recent flare up over the past 6 months. Patient has poor posture and alignment(in ~ 2 inch heels today); muscular tightness to palpation through the lateral quad into leg; poor patellar alignment; limited functional activity level due to pain; pain on a daily basis. She has constant joint pain due to Lyme's disease ~ 18 years ago which may infulence  knee rehab and limit patient's ability to perform exercises for stretching and strengthening. Patient will benefit from PT to address problems identified.    Stability/Clinical Decision Making  Stable/Uncomplicated    Clinical Decision Making  Low    Rehab Potential  Good    PT Frequency  2x / week    PT Treatment/Interventions  Patient/family education;ADLs/Self Care Home Management;Cryotherapy;Electrical Stimulation;Iontophoresis 4mg /ml Dexamethasone;Moist Heat;Ultrasound;Therapeutic activities;Therapeutic exercise;Balance training;Neuromuscular re-education;Dry needling;Taping       Patient will benefit from skilled therapeutic intervention in order to improve the following deficits and impairments:     Visit Diagnosis: Chronic pain of left knee - Plan: PT plan of care cert/re-cert  Muscle weakness (generalized) - Plan: PT plan of care cert/re-cert     Problem List Patient Active Problem List   Diagnosis Date Noted  . Arthralgia 06/20/2019  . H/o Lyme disease 06/19/2019  . Onychomycosis due to dermatophyte 11/02/2016  . Urge incontinence 10/12/2013  . Bladder cystocele 09/29/2013  . Irritable bowel syndrome (IBS) 12/13/2012  . Perimenopausal symptoms 12/13/2012  . 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 Left cervical radiculopathy 03/18/2011  . Gluten intolerance 08/15/2010  . Allergic rhinitis 06/17/2010  . Rosacea 05/06/2007    Aziyah Provencal Nilda Simmer PT, MPH  07/04/2019, 11:10 AM  [facility] [address] [place], [place], [address] Phone: [phone]   Fax:  [phone]  Name: Danielle Nelson MRN: [id number] Date of Birth: 1970-07-31

## 2019-07-07 ENCOUNTER — Encounter: Payer: Self-pay | Admitting: Physical Therapy

## 2019-07-07 ENCOUNTER — Ambulatory Visit: Payer: BC Managed Care – PPO | Admitting: Physical Therapy

## 2019-07-07 ENCOUNTER — Other Ambulatory Visit: Payer: Self-pay

## 2019-07-07 DIAGNOSIS — M6281 Muscle weakness (generalized): Secondary | ICD-10-CM

## 2019-07-07 DIAGNOSIS — G8929 Other chronic pain: Secondary | ICD-10-CM

## 2019-07-07 DIAGNOSIS — M25562 Pain in left knee: Secondary | ICD-10-CM | POA: Diagnosis not present

## 2019-07-07 NOTE — Patient Instructions (Signed)
Access Code: [email or web address]: [email or web address]: 04/23/2021Prepared by: [facility] [place]  Hooklying Hamstring Stretch with Strap - 2 x daily - 7 x weekly - 2-3 reps - 1 sets - 30 sec hold  Supine ITB Stretch with Strap - 1 x daily - 7 x weekly - 3 sets - 2-3 reps - 30 hold  Prone Quadriceps Stretch with Strap - 2 x daily - 7 x weekly - 1 sets - 2-3 reps - 30 sec hold  Supine Active Straight Leg Raise - 1 x daily - 7 x weekly - 2 sets - 10 reps  Supine Bridge - 1 x daily - 7 x weekly - 2 sets - 10 reps  Squat with Chair Touch - 1 x daily - 7 x weekly - 1 sets - 10 reps  Roller Massage Elongated IT Band Release - 1 x daily - 7 x weekly - 1 sets

## 2019-07-07 NOTE — Therapy (Signed)
[facility] [place] [address] [place], [place], [address] Phone: [phone]   Fax:  [phone]  Physical Therapy Treatment  Patient Details  Name: Danielle Nelson MRN: [id number] Date of Birth: 08-25-1970 Referring Provider (PT): Dr Clementeen Graham     Encounter Date: 07/07/2019  PT End of Session - 07/07/19 1020    Visit Number  2    Number of Visits  12    Date for PT Re-Evaluation  08/15/19    PT Start Time  0935    PT Stop Time  1020    PT Time Calculation (min)  45 min       Past Medical History:  Diagnosis Date  . Lyme disease     History reviewed. No pertinent surgical history.  There were no vitals filed for this visit.  Subjective Assessment - 07/07/19 0941    Subjective  Pt reports that Lt knee pain increases during ADL's and walking short distances. The pain continues when she is resting.    Currently in Pain?  No/denies    Pain Score  0-No pain    Pain Onset  More than a month ago    Pain Frequency  Intermittent    Aggravating Factors   walking, ADL's    Pain Relieving Factors  taping         [facility] PT Assessment - 07/07/19 0001      Assessment   Medical Diagnosis  Lt knee pain; patellofemoral pain     Referring Provider (PT)  Dr Clementeen Graham      Onset Date/Surgical Date  01/15/20    Hand Dominance  Right    Next MD Visit  PRN     Prior Therapy  18 years ago       [facility] Adult PT Treatment/Exercise - 07/07/19 0001      Self-Care   Self-Care  Other Self-Care Comments      Other Self-Care Comments   pt ed. on self massage to lateral and distal Lt knee to decrease pain and tightness; pt returned demo with cues     Knee/Hip Exercises: Stretches   Passive Hamstring Stretch  Left;2 reps;20 seconds   supine with strap    Quad Stretch  Left;3 reps;20 seconds   prone with strap, noodle above knee    ITB Stretch  Left;2 reps;20 seconds   supine with strap     Knee/Hip Exercises: Aerobic   Recumbent  Bike  L1, 4 mins   pt c/o Rt knee discomfort, discontinued     Knee/Hip Exercises: Standing   Lateral Step Up  1 set;5 reps;Step Height: 4";Hand Hold: 1;Right   heel taps Lt; discontinued,  increase in Rt knee discomfort   Step Down  Right;Step Height: 4";Hand Hold: 2;10 reps;1 set   toe taps with Lt, pt tolerated better than lateral   Functional Squat  2 sets;10 reps   to raised high/low table, pt tolerate well.      Knee/Hip Exercises: Supine   Short Arc Quad Sets  1 set;Left;10 reps;Strengthening   3 sec hold in ext   Bridges  1 set;10 reps   Pelvic tilts  after 5 reps to increase comfort   Straight Leg Raises  Strengthening;Left;1 set;10 reps   3 sec hold     Manual Therapy   Kinesiotex  Inhibit Muscle   3" dynamic tape to lat Lt knee in K shape for patellar stability       PT Education -  07/07/19 1136    Education Details  Updated HEP, self massage Lt knee/ ITB (manual and roller stick)    Person(s) Educated  Patient    Methods  Handout;Explanation    Comprehension  Verbalized understanding;Returned demonstration          PT Long Term Goals - 07/07/19 1131      PT LONG TERM GOAL #1   Title  Increase LE functional strength with patient to report ability and stand and walk for 15-20 min with minimal to no increase in pain.    Time  6    Period  Weeks    Status  New      PT LONG TERM GOAL #2   Title  Decrease pain by 50-75% allowing patient to progress with ADL's    Time  6    Period  Weeks    Status  New      PT LONG TERM GOAL #3   Title  Patient to demonstrate increased tissue extensibility in hamstrings and piriformis musculature with mobility equal bilat    Time  6    Period  Weeks    Status  New      PT LONG TERM GOAL #4   Title  Independent in HEP    Time  6    Period  Weeks    Status  New      PT LONG TERM GOAL #5   Title  Improve FOTO to </= 37% limitation    Time  6    Period  Weeks    Status  New            Plan - 07/07/19 1131     Clinical Impression Statement  Pt reported increase in Lt knee discomfort with bike, heel taps; shortened reps or discontinued. She tolerated ITB and hamstring stretches well; added ITB to HEP per pt request. Lateral steps on stairs were less tolerable than front steps. Squats to a high table were tolerable with no increase in Lt knee pain. She stated that bridges helped to ease LBP that started with the knee pain. Positive response to Ktape application; reapplied but with dynamic tape instead of Rock tape. Progressing towards goals.    Rehab Potential  Good    PT Frequency  2x / week    PT Treatment/Interventions  Patient/family education;ADLs/Self Care Home Management;Cryotherapy;Electrical Stimulation;Iontophoresis 4mg /ml Dexamethasone;Moist Heat;Ultrasound;Therapeutic activities;Therapeutic exercise;Balance training;Neuromuscular re-education;Dry needling;Taping    PT Next Visit Plan  stairs to tolerance, ITB manual work, bridges. low back care with stretched for knee.       Patient will benefit from skilled therapeutic intervention in order to improve the following deficits and impairments:     Visit Diagnosis: Chronic pain of left knee  Muscle weakness (generalized)     Problem List Patient Active Problem List   Diagnosis Date Noted  . Arthralgia 06/20/2019  . H/o Lyme disease 06/19/2019  . Onychomycosis due to dermatophyte 11/02/2016  . Urge incontinence 10/12/2013  . Bladder cystocele 09/29/2013  . Irritable bowel syndrome (IBS) 12/13/2012  . Perimenopausal symptoms 12/13/2012  . 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 Left cervical radiculopathy 03/18/2011  . Gluten intolerance 08/15/2010  . Allergic rhinitis 06/17/2010  . Rosacea 05/06/2007   Ronaldo Miyamoto, SPTA 07/07/19 1:29 PM  This entire session was performed under direct supervision and direction of a licensed Physical Brewing technologist. I have personally read, edited and approved of the note as written.  Kerin Perna, PTA 07/07/19 1:38 PM;  [facility] [address] [place], [place], [address] Phone: [phone]   Fax:  [phone]  Name: Danielle Nelson MRN: [id number] Date of Birth: 1971/01/04

## 2019-07-11 ENCOUNTER — Encounter: Payer: Self-pay | Admitting: Rehabilitative and Restorative Service Providers"

## 2019-07-11 ENCOUNTER — Ambulatory Visit (INDEPENDENT_AMBULATORY_CARE_PROVIDER_SITE_OTHER): Payer: BC Managed Care – PPO | Admitting: Rehabilitative and Restorative Service Providers"

## 2019-07-11 ENCOUNTER — Other Ambulatory Visit: Payer: Self-pay

## 2019-07-11 DIAGNOSIS — M25562 Pain in left knee: Secondary | ICD-10-CM

## 2019-07-11 DIAGNOSIS — G8929 Other chronic pain: Secondary | ICD-10-CM

## 2019-07-11 DIAGNOSIS — M6281 Muscle weakness (generalized): Secondary | ICD-10-CM | POA: Diagnosis not present

## 2019-07-11 NOTE — Patient Instructions (Signed)
Access Code: [email or web address]: [email or web address]: 04/27/2021Prepared by: Karlene Southard HoltExercises  Gastroc Stretch on Wall - 2 x daily - 7 x weekly - 3 reps - 1 sets - 30 sec hold  Standing Soleus Stretch - 2 x daily - 7 x weekly - 3 reps - 1 sets - 30 sec hold  Seated Hamstring Stretch - 2 x daily - 7 x weekly - 3 reps - 1 sets - 30 sec hold

## 2019-07-11 NOTE — Therapy (Signed)
[facility] [place] [address] [place], [place], [address] Phone: [phone]   Fax:  [phone]  Physical Therapy Treatment  Patient Details  Name: Danielle Nelson MRN: [id number] Date of Birth: Jun 30, 1970 Referring Provider (PT): Dr Clementeen Graham     Encounter Date: 07/11/2019  PT End of Session - 07/11/19 0809    Visit Number  3    Number of Visits  12    Date for PT Re-Evaluation  08/15/19    PT Start Time  0807    PT Stop Time  0853    PT Time Calculation (min)  46 min    Activity Tolerance  Patient tolerated treatment well       Past Medical History:  Diagnosis Date  . Lyme disease     History reviewed. No pertinent surgical history.  There were no vitals filed for this visit.  Subjective Assessment - 07/11/19 0810    Subjective  Patient reports that she has some stiffness in the knee today and feels that it is difficult to stretch her knee out straight. Some of the exercises are OK and others are hard    Currently in Pain?  Yes    Pain Score  4     Pain Location  Knee    Pain Descriptors / Indicators  Aching;Tightness                       [facility] Adult PT Treatment/Exercise - 07/11/19 0001      Knee/Hip Exercises: Stretches   Passive Hamstring Stretch  Left;2 reps;20 seconds   supine with strap    Passive Hamstring Stretch Limitations  seated HS stretch 30 sec x 2 reps Lt     Quad Stretch  Left;3 reps;20 seconds   prone with strap    ITB Stretch  Left;2 reps;20 seconds   supine with strap   ITB Stretch Limitations  PT assisted ITB stretch w/pt sidelying Lt LE off edge of table    Gastroc Stretch  Left;2 reps;30 seconds    Soleus Stretch  Left;2 reps;30 seconds      Knee/Hip Exercises: Aerobic   Nustep  L5 x 5 min UE 9       Knee/Hip Exercises: Supine   Straight Leg Raises  Strengthening;Left;1 set;10 reps   3 sec hold - suggested slight ER w/ SLR - pause as needed    Straight Leg Raises  Limitations  pt reports some discomfort end of set       Manual Therapy   Manual therapy comments  pt supine and Rt sidelying     Soft tissue mobilization  deep tissue work IASTM and manual work through the FTL/ITB/lateral thigh/lateral quad/lateral calf     Myofascial Release  lateral thigh     Passive ROM  manual work through the lateral thigh in stretch position for ITB     Kinesiotex  Inhibit Muscle   dynamic tape to lat Lt knee; patellar stability            PT Education - 07/11/19 0847    Education Details  HEP    Person(s) Educated  Patient    Methods  Explanation;Demonstration;Tactile cues;Verbal cues;Handout    Comprehension  Verbalized understanding;Returned demonstration;Verbal cues required;Tactile cues required          PT Long Term Goals - 07/07/19 1131      PT LONG TERM GOAL #1   Title  Increase LE functional strength with  patient to report ability and stand and walk for 15-20 min with minimal to no increase in pain.    Time  6    Period  Weeks    Status  New      PT LONG TERM GOAL #2   Title  Decrease pain by 50-75% allowing patient to progress with ADL's    Time  6    Period  Weeks    Status  New      PT LONG TERM GOAL #3   Title  Patient to demonstrate increased tissue extensibility in hamstrings and piriformis musculature with mobility equal bilat    Time  6    Period  Weeks    Status  New      PT LONG TERM GOAL #4   Title  Independent in HEP    Time  6    Period  Weeks    Status  New      PT LONG TERM GOAL #5   Title  Improve FOTO to </= 37% limitation    Time  6    Period  Weeks    Status  New            Plan - 07/11/19 0815    Clinical Impression Statement  Continued pain in the Lt knee - but some improvement since beginning therapy and exercise. Will add stretches in the am to address morning stiffness. Added gastroc and soleus stretches. Continued with stretches and strengthening. Repeated taping which was helpful. Added  manual work lateral thigh to lateral knee to lateral calf with good response - decreased palpable tightness and pt reporting improved mobility.    Rehab Potential  Good    PT Frequency  2x / week    PT Duration  6 weeks    PT Treatment/Interventions  Patient/family education;ADLs/Self Care Home Management;Cryotherapy;Electrical Stimulation;Iontophoresis 4mg /ml Dexamethasone;Moist Heat;Ultrasound;Therapeutic activities;Therapeutic exercise;Balance training;Neuromuscular re-education;Dry needling;Taping    PT Next Visit Plan  stretching and strengthening Lt LE; manual work and taping as indicated; assess response to manual work    PT [place]    Consulted and Agree with Plan of Care  Patient       Patient will benefit from skilled therapeutic intervention in order to improve the following deficits and impairments:     Visit Diagnosis: Chronic pain of left knee  Muscle weakness (generalized)     Problem List Patient Active Problem List   Diagnosis Date Noted  . Arthralgia 06/20/2019  . H/o Lyme disease 06/19/2019  . Onychomycosis due to dermatophyte 11/02/2016  . Urge incontinence 10/12/2013  . Bladder cystocele 09/29/2013  . Irritable bowel syndrome (IBS) 12/13/2012  . Perimenopausal symptoms 12/13/2012  . 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 Left cervical radiculopathy 03/18/2011  . Gluten intolerance 08/15/2010  . Allergic rhinitis 06/17/2010  . Rosacea 05/06/2007    Brolin Dambrosia Nilda Simmer PT, MPH  07/11/2019, 9:07 AM  [facility] [place], [place], [address] Phone: [phone]   Fax:  [phone]  Name: Cecille Mcclusky MRN: [id number] Date of Birth: [birth year]

## 2019-07-13 ENCOUNTER — Ambulatory Visit (INDEPENDENT_AMBULATORY_CARE_PROVIDER_SITE_OTHER): Payer: BC Managed Care – PPO | Admitting: Physical Therapy

## 2019-07-13 ENCOUNTER — Encounter: Payer: Self-pay | Admitting: Physical Therapy

## 2019-07-13 ENCOUNTER — Other Ambulatory Visit: Payer: Self-pay

## 2019-07-13 DIAGNOSIS — G8929 Other chronic pain: Secondary | ICD-10-CM | POA: Diagnosis not present

## 2019-07-13 DIAGNOSIS — M6281 Muscle weakness (generalized): Secondary | ICD-10-CM

## 2019-07-13 DIAGNOSIS — M25562 Pain in left knee: Secondary | ICD-10-CM

## 2019-07-13 NOTE — Patient Instructions (Addendum)
Access Code: [email or web address] URL: [email or web address]: 04/29/2021Prepared by: [facility] [place]Exercises  Standing Soleus Stretch - 2 x daily - 7 x weekly - 1 sets - 2 reps - 30 seconds hold  Gastroc Stretch on Wall - 2 x daily - 7 x weekly - 1 sets - 2 reps - 30 seconds hold  Seated Hamstring Stretch - 2 x daily - 7 x weekly - 1 sets - 2-3 reps - 30 seconds hold  Hooklying Hamstring Stretch with Strap - 2 x daily - 7 x weekly - 2-3 reps - 1 sets - 30 sec hold  Supine ITB Stretch with Strap - 1 x daily - 7 x weekly - 3 sets - 2-3 reps - 30 hold  Prone Quadriceps Stretch with Strap - 2 x daily - 7 x weekly - 1 sets - 2-3 reps - 30 sec hold  Supine Active Straight Leg Raise - 1 x daily - 7 x weekly - 2 sets - 10 reps  Supine Bridge - 1 x daily - 7 x weekly - 2 sets - 10 reps  Squat with Chair Touch - 1 x daily - 7 x weekly - 1 sets - 10 reps  Roller Massage Elongated IT Band Release - 1 x daily - 7 x weekly - 1 sets

## 2019-07-13 NOTE — Therapy (Signed)
[place], [place], [address] Phone: [phone]   Fax:  [phone]  Physical Therapy Treatment  Patient Details  Name: Danielle Nelson MRN: [id number] Date of Birth: November 03, 1970 Referring Provider (PT): Dr Lynne Leader     Encounter Date: 07/13/2019  PT End of Session - 07/13/19 0805    Visit Number  4    Number of Visits  12    Date for PT Re-Evaluation  08/15/19    PT Start Time  0803    PT Stop Time  4742    PT Time Calculation (min)  44 min       Past Medical History:  Diagnosis Date  . Lyme disease     History reviewed. No pertinent surgical history.  There were no vitals filed for this visit.  Subjective Assessment - 07/13/19 0806    Subjective  Pt has a full weekened of standing and moving ahead of her. She also states that she is going to try to start walking around a paved track at her sons soccer field. Pt reported that HEP is going well and she has been compliant with all exercises.    Pertinent History  multiple joint pain following Lyme's disease 2003 - continues to have constant joint pain    Currently in Pain?  No/denies    Pain Score  0-No pain    Pain Location  Knee    Pain Orientation  Left    Pain Onset  More than a month ago    Aggravating Factors   standing, ADL's, pushing exercises    Pain Relieving Factors  taping        [facility] Adult PT Treatment/Exercise - 07/13/19 0001      Knee/Hip Exercises: Stretches   Quad Stretch  Right;Left;1 rep;2 reps;30 seconds   prone with strap, noodle above knee  Trial standing quad stretch in standing with foot supported on raised table; unable to hold foot.    Quad Stretch Limitations  Rt 1, Lt 2; pt ed. on standing quad stretch x1, 10 sec    Gastroc Stretch  Right;Left;1 rep;30 seconds    Soleus Stretch  Right;Left;1 rep;30 seconds    Other Knee/Hip Stretches  half happy baby, Rt, Lt, 1 rep, 15 seconds   supine with strap      Knee/Hip Exercises: Aerobic   Nustep  L5 x 4 min UE 9    mild increase in Lt knee discomfort.      Knee/Hip Exercises: Standing   Wall Squat  1 set;10 reps;10 seconds    SLS  10, 20 & 30 sec; blue airex; LLE   progressed from DLS to SLS Lt, increase 10 sec interval   Other Standing Knee Exercises  laps between exercises to prevent stiffness x 1    Other Standing Knee Exercises  mini pulse lunges; 10 each LE, 2 sec up/down; min VC's needed for set up   in front of mirror to promote self correction of form     Knee/Hip Exercises: Supine   Quad Sets  Left;1 set;10 reps   5 sec hold   Short Arc Quad Sets  1 set;Left;10 reps;Strengthening   3 sec hold   Straight Leg Raises  Strengthening;Left;2 sets;5 reps   5 sec hold   Straight Leg Raises Limitations  no pain reported with splitting the 10 reps into 2 sets        PT Education - 07/13/19 0828    Education  Details  HEP - consolidated from previous visit.    Person(s) Educated  Patient    Methods  Explanation    Comprehension  Verbalized understanding         PT Long Term Goals - 07/13/19 0857      PT LONG TERM GOAL #1   Title  Increase LE functional strength with patient to report ability and stand and walk for 15-20 min with minimal to no increase in pain.    Time  6    Period  Weeks    Status  New      PT LONG TERM GOAL #2   Title  Decrease pain by 50-75% allowing patient to progress with ADL's    Time  6    Period  Weeks    Status  New      PT LONG TERM GOAL #3   Title  Patient to demonstrate increased tissue extensibility in hamstrings and piriformis musculature with mobility equal bilat    Time  6    Period  Weeks    Status  New      PT LONG TERM GOAL #4   Title  Independent in HEP    Time  6    Period  Weeks    Status  New      PT LONG TERM GOAL #5   Title  Improve FOTO to </= 37% limitation    Time  6    Period  Weeks    Status  New        Plan - 07/13/19 0831    Clinical Impression Statement   Lt knee pain continues with prolonged standing and walking, however stairs are no longer painful.   Stronger emphasis on strengthening exercises today; pt tolerated well with minimal to no increase pain.  4/10 pain reported with NuStep.  Unable to complete standing quad stretch; will remain in supine for HEP. Goals are on-going.    Rehab Potential  Good    PT Frequency  2x / week    PT Duration  6 weeks    PT Treatment/Interventions  Patient/family education;ADLs/Self Care Home Management;Cryotherapy;Electrical Stimulation;Iontophoresis 4mg /ml Dexamethasone;Moist Heat;Ultrasound;Therapeutic activities;Therapeutic exercise;Balance training;Neuromuscular re-education;Dry needling;Taping    PT Next Visit Plan  stretching and strengthening Lt LE; manual work and taping as indicated; assess response to manual work; SLS and balance LLE    PT Home Exercise Plan  Access Code: [email or web address]    Consulted and Agree with Plan of Care  Patient       Patient will benefit from skilled therapeutic intervention in order to improve the following deficits and impairments:     Visit Diagnosis: Chronic pain of left knee  Muscle weakness (generalized)     Problem List Patient Active Problem List   Diagnosis Date Noted  . Arthralgia 06/20/2019  . H/o Lyme disease 06/19/2019  . Onychomycosis due to dermatophyte 11/02/2016  . Urge incontinence 10/12/2013  . Bladder cystocele 09/29/2013  . Irritable bowel syndrome (IBS) 12/13/2012  . Perimenopausal symptoms 12/13/2012  . 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 Left cervical radiculopathy 03/18/2011  . Gluten intolerance 08/15/2010  . Allergic rhinitis 06/17/2010  . Rosacea 05/06/2007   This entire session was performed under direct supervision and direction of a licensed Physical 05/08/2007. I have personally read, edited and approved of the note as written.  Environmental health practitioner, PTA 07/13/19 11:29 AM  07/15/19, SPTA 07/13/19 11:29 AM     [facility] [address] [place]  [address] [place], [place], [address] Phone: [phone]   Fax:  [phone]  Name: Danielle Nelson MRN: [id number] Date of Birth: 06/08/1970

## 2019-07-17 ENCOUNTER — Other Ambulatory Visit: Payer: Self-pay

## 2019-07-17 ENCOUNTER — Encounter: Payer: Self-pay | Admitting: Physical Therapy

## 2019-07-17 ENCOUNTER — Ambulatory Visit (INDEPENDENT_AMBULATORY_CARE_PROVIDER_SITE_OTHER): Payer: BC Managed Care – PPO | Admitting: Physical Therapy

## 2019-07-17 DIAGNOSIS — M6281 Muscle weakness (generalized): Secondary | ICD-10-CM | POA: Diagnosis not present

## 2019-07-17 DIAGNOSIS — M25562 Pain in left knee: Secondary | ICD-10-CM | POA: Diagnosis not present

## 2019-07-17 DIAGNOSIS — G8929 Other chronic pain: Secondary | ICD-10-CM

## 2019-07-17 NOTE — Therapy (Signed)
[place], [place], [address] Phone: [phone]   Fax:  [phone]  Physical Therapy Treatment  Patient Details  Name: Danielle Nelson MRN: [id number] Date of Birth: 1971/01/20 Referring Provider (PT): Dr Lynne Leader     Encounter Date: 07/17/2019  PT End of Session - 07/17/19 0809    Visit Number  5    Number of Visits  12    Date for PT Re-Evaluation  08/15/19    PT Start Time  0805    PT Stop Time  0851    PT Time Calculation (min)  46 min  (ice last 10 min)      Past Medical History:  Diagnosis Date  . Lyme disease     History reviewed. No pertinent surgical history.  There were no vitals filed for this visit.  Subjective Assessment - 07/17/19 0809    Subjective  She reports standing all weeked in heels but having no increases in Lt knee pain. She states she has overall muscle soreness from the busy weekend.    Currently in Pain?  No/denies    Pain Score  0-No pain         [facility] PT Assessment - 07/17/19 0001      Assessment   Medical Diagnosis  Lt knee pain; patellofemoral pain     Referring Provider (PT)  Dr Lynne Leader      Onset Date/Surgical Date  01/15/20    Hand Dominance  Right    Next MD Visit  PRN     Prior Therapy  18 years ago         [facility] Adult PT Treatment/Exercise - 07/17/19 0001      Knee/Hip Exercises: Stretches   Passive Hamstring Stretch  Left;2 reps;20 seconds   supine with strap, Rt 1 set, x 20 sec   Quad Stretch  Right;Left;1 rep;2 reps;20 seconds   prone with strap, noodle under thigh   ITB Stretch  Left;2 reps;20 seconds   supine with strap   Piriformis Stretch  1 rep;Left;20 seconds   supine, trial for HEP form, tolerated well   Gastroc Stretch  30 seconds;Both;2 reps   decline board     Knee/Hip Exercises: Aerobic   Recumbent Bike  L2, 4 mins   mild Lt knee discomfort at min 3     Knee/Hip Exercises: Standing   Functional Squat  2 sets;10  reps   elevator squats (full squat, half squat, full squat, up)   Functional Squat Limitations  shake out legs between sets    SLS  warrior 3, Lt, x10, VC/tactile cues for form      Modalities   Modalities  Cryotherapy   CP to Lt knee, 10 mins     Manual Therapy   Kinesiotex  Inhibit Muscle   dynamic tape to lat Lt knee in " K"; patellar stability      PT Long Term Goals - 07/17/19 0907      PT LONG TERM GOAL #1   Title  Increase LE functional strength with patient to report ability and stand and walk for 15-20 min with minimal to no increase in pain.    Time  6    Period  Weeks    Status  On-going      PT LONG TERM GOAL #2   Title  Decrease pain by 50-75% allowing patient to progress with ADL's    Time  6    Period  Weeks    Status  On-going      PT LONG TERM GOAL #3   Title  Patient to demonstrate increased tissue extensibility in hamstrings and piriformis musculature with mobility equal bilat    Time  6    Period  Weeks    Status  On-going      PT LONG TERM GOAL #4   Title  Independent in HEP    Time  6    Period  Weeks    Status  On-going      PT LONG TERM GOAL #5   Title  Improve FOTO to </= 37% limitation    Time  6    Period  Weeks    Status  On-going        Plan - 07/17/19 3016    Clinical Impression Statement  Lt knee pain has been reduced per pt statement. Emphasis on single leg strength today; mild increase in medial Lt knee discomfort, reduced with corrected form. Added piriformis stretch to HEP; will address LTG 3 next visit. Pt is progressing well with therapy; will benefit from continued PT for LE strength. Progressing towards all goals.    Stability/Clinical Decision Making  Stable/Uncomplicated    Rehab Potential  Good    PT Frequency  2x / week    PT Treatment/Interventions  Patient/family education;ADLs/Self Care Home Management;Cryotherapy;Electrical Stimulation;Iontophoresis 4mg /ml Dexamethasone;Moist Heat;Ultrasound;Therapeutic  activities;Therapeutic exercise;Balance training;Neuromuscular re-education;Dry needling;Taping    PT Next Visit Plan  stretching and strengthening Lt LE; manual work and taping as indicated; assess response to manual work; SLS and balance LLE, SL strength via LAQ & SL wall squat. LTG 3 assessment   PT Home Exercise Plan  Access Code: [email or web address]       Patient will benefit from skilled therapeutic intervention in order to improve the following deficits and impairments:     Visit Diagnosis: Chronic pain of left knee  Muscle weakness (generalized)     Problem List Patient Active Problem List   Diagnosis Date Noted  . Arthralgia 06/20/2019  . H/o Lyme disease 06/19/2019  . Onychomycosis due to dermatophyte 11/02/2016  . Urge incontinence 10/12/2013  . Bladder cystocele 09/29/2013  . Irritable bowel syndrome (IBS) 12/13/2012  . Perimenopausal symptoms 12/13/2012  . 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 Left cervical radiculopathy 03/18/2011  . Gluten intolerance 08/15/2010  . Allergic rhinitis 06/17/2010  . Rosacea 05/06/2007    05/08/2007, SPTA 07/17/19 9:18 AM  This entire session was performed under direct supervision and direction of a licensed Physical 09/16/19. I have personally read, edited and approved of the note as written.  Environmental health practitioner, PTA 07/17/19 1:32 PM   [facility] [address] [place], [place], [place] Phone: [phone]   Fax:  [phone]  Name: Danielle Nelson MRN: [name] Date of Birth: Jan 24, 1971

## 2019-07-18 ENCOUNTER — Ambulatory Visit: Payer: BC Managed Care – PPO | Admitting: Gastroenterology

## 2019-07-18 ENCOUNTER — Encounter: Payer: Self-pay | Admitting: Gastroenterology

## 2019-07-18 VITALS — BP 114/74 | HR 84 | Temp 97.3°F | Ht 64.0 in | Wt 168.2 lb

## 2019-07-18 DIAGNOSIS — Z1211 Encounter for screening for malignant neoplasm of colon: Secondary | ICD-10-CM | POA: Diagnosis not present

## 2019-07-18 DIAGNOSIS — R945 Abnormal results of liver function studies: Secondary | ICD-10-CM | POA: Diagnosis not present

## 2019-07-18 DIAGNOSIS — R7989 Other specified abnormal findings of blood chemistry: Secondary | ICD-10-CM

## 2019-07-18 DIAGNOSIS — K580 Irritable bowel syndrome with diarrhea: Secondary | ICD-10-CM | POA: Diagnosis not present

## 2019-07-18 NOTE — Progress Notes (Signed)
Chief Complaint:   Referring Provider:  Sunnie Nielsen, DO      ASSESSMENT AND PLAN;   #1.  Abn LFTs s/o hepatocellular pattern, likely d/t fatty liver. Neg acute hep panel. Neg [place] 06/2019 except for fatty liver. Elevated GGT. No ETOH, nl ferritin  #2. IBS-D  #3. Screening colon -if covered age>45    Plan: -Start exercising and reduce weight.  Exercise like walking 30 min/day, at least 3 x week.  -Record wt every day and bring it along the at the next FU visit. -Change diet to more grilled foods rather than fried foods. Can try Mediterranean diet. -Check AMA, ASMA, ceruloplasmin, A1AT, celiac screen.   -Also check anti-HAV total Ab and HBsAb titer.  If neg, would need vaccination for Hep A and B. -Can have up to 2 cups of coffee per day. -Vitamin E 400 IU po QD. -FU in 12 weeks.  Aim is to reduce 6 lbs over next 12 weeks. -At FU- recheck LFTs.  -Have also discussed USE/liver biopsy.  At the present time, patient would like to hold off on both. I do agree. -Proceed with colonoscopy for colorectal cancer screening if it is covered.    HPI:    Danielle Nelson is a 49 y.o. female  For abnormal LFTs as detailed below. Has gained 35 pounds since Covid shutdown March 2020. Also has been having problems with left knee and has not been walking much.  She is currently going through physical therapy.  No H/O itching, skin lesions, easy bruisability, intake of OTC meds including diet pills, herbal medications, anabolic steroids or Tylenol. There is no H/O blood transfusions, IVDA or FH of liver disease. No jaundice, dark urine or pale stools. No alcohol abuse.  Her triglycerides and total cholesterol was elevated as well.  No statins.  Does complain of intermittent diarrhea with bloating x over 10 years.  Underwent flex sig several years ago.  She was diagnosed as having irritable bowel syndrome.  Also was told that she is "gluten intolerant".  I do not see celiac screen.  She is  Mediterranean.  No family history of celiac disease.  No significant heartburn.  Mom had abn LFTs -she is tall and thin.  Never been diagnosed reason for abnormal LFTs.  She does have hypercholesterolemia.  Underwent right upper quad ultrasound which showed fatty liver.  Wt Readings from Last 3 Encounters:  07/18/19 168 lb 4 oz (76.3 kg)  06/20/19 165 lb (74.8 kg)  06/19/19 165 lb (74.8 kg)    Past Medical History:  Diagnosis Date  . IBS (irritable bowel syndrome)   . Lyme disease     Past Surgical History:  Procedure Laterality Date  . BREAST BIOPSY Left   . FLEXIBLE SIGMOIDOSCOPY     was told she has ibs and gluten sensitivity. around 2008    Family History  Problem Relation Age of Onset  . High blood pressure Mother   . Parkinson's disease Father   . Stroke Maternal Grandmother   . Colon cancer Neg Hx   . Esophageal cancer Neg Hx     Social History   Tobacco Use  . Smoking status: Never Smoker  . Smokeless tobacco: Never Used  Substance Use Topics  . Alcohol use: Not Currently  . Drug use: Never    Current Outpatient Medications  Medication Sig Dispense Refill  . ibuprofen (ADVIL) 200 MG tablet Take 200 mg by mouth as needed.     . Multiple Vitamin (  MULTI-VITAMIN) tablet Take 1 tablet by mouth daily.      No current facility-administered medications for this visit.    No Known Allergies  Review of Systems:  Constitutional: Denies fever, chills, diaphoresis, appetite change and fatigue.  HEENT: Denies photophobia, eye pain, redness, hearing loss, ear pain, congestion, sore throat, rhinorrhea, sneezing, mouth sores, neck pain, neck stiffness and tinnitus.   Respiratory: Denies SOB, DOE, cough, chest tightness,  and wheezing.   Cardiovascular: Denies chest pain, palpitations and leg swelling.  Genitourinary: Denies dysuria, urgency, frequency, hematuria, flank pain and difficulty urinating.  Musculoskeletal: Denies myalgias, back pain, joint swelling,  arthralgias and gait problem.  Skin: No rash.  Neurological: Denies dizziness, seizures, syncope, weakness, light-headedness, numbness and headaches.  Hematological: Denies adenopathy. Easy bruising, personal or family bleeding history  Psychiatric/Behavioral: No anxiety or depression     Physical Exam:    Temp (!) 97.3 F (36.3 C)   Ht 5\' 4"  (1.626 m)   Wt 168 lb 4 oz (76.3 kg)   BMI 28.88 kg/m  Wt Readings from Last 3 Encounters:  07/18/19 168 lb 4 oz (76.3 kg)  06/20/19 165 lb (74.8 kg)  06/19/19 165 lb (74.8 kg)   Constitutional:  Well-developed, in no acute distress. Psychiatric: Normal mood and affect. Behavior is normal. HEENT: Pupils normal.  Conjunctivae are normal. No scleral icterus. Neck supple.  Cardiovascular: Normal rate, regular rhythm. No edema Pulmonary/chest: Effort normal and breath sounds normal. No wheezing, rales or rhonchi. Abdominal: Soft, nondistended. Nontender. Bowel sounds active throughout. There are no masses palpable.  Liver is palpated 2 cm below costal margin.  Nontender. Rectal:  defered Neurological: Alert and oriented to person place and time. Skin: Skin is warm and dry. No rashes noted.  Data Reviewed: I have personally reviewed following labs and imaging studies  CBC: CBC Latest Ref Rng & Units 06/09/2019 12/29/2007 12/28/2007  WBC 3.8 - 10.8 Thousand/uL 7.1 12.4(H) 10.5  Hemoglobin 11.7 - 15.5 g/dL 13.2 11.3(L) 13.1  Hematocrit 35.0 - 45.0 % 40.2 34.5(L) 39.5  Platelets 140 - 400 Thousand/uL 228 142(L) 156    CMP: CMP Latest Ref Rng & Units 06/15/2019 06/09/2019  Glucose 65 - 99 mg/dL - 72  BUN 7 - 25 mg/dL - 10  Creatinine 0.50 - 1.10 mg/dL - 0.66  Sodium 135 - 146 mmol/L - 141  Potassium 3.5 - 5.3 mmol/L - 4.3  Chloride 98 - 110 mmol/L - 103  CO2 20 - 32 mmol/L - 29  Calcium 8.6 - 10.2 mg/dL - 9.8  Total Protein 6.1 - 8.1 g/dL 6.9 7.0  Total Bilirubin 0.2 - 1.2 mg/dL 0.4 0.3  AST 10 - 35 U/L 51(H) 44(H)  ALT 6 - 29 U/L 99(H)  109(H)   Hepatic Function Latest Ref Rng & Units 06/15/2019 06/09/2019  Total Protein 6.1 - 8.1 g/dL 6.9 7.0  AST 10 - 35 U/L 51(H) 44(H)  ALT 6 - 29 U/L 99(H) 109(H)  Total Bilirubin 0.2 - 1.2 mg/dL 0.4 0.3  Bilirubin, Direct 0.0 - 0.2 mg/dL 0.1 -      Radiology Studies: [place] LIMITED JOINT SPACE STRUCTURES LOW LEFT  Result Date: 07/04/2019 Diagnostic Limited MSK Ultrasound of: Left knee Quad tendon intact normal-appearing Lateral aspect of quad tendon insertion of bony prominence appears to be spur formation on ultrasound.  No increased vascular activity in this area. Patellar tendon margin normal-appearing slight hypoechoic fluid tracking both superficial and deep to patellar tendon distally. Medial lateral joint line largely normal-appearing slight  degenerative appearance No Baker's cyst Impression: Slight prepatellar bursitis.  Spur formation at quad tendon insertion superior lateral patella     Edman Circle, MD 07/18/2019, 3:24 PM  Cc: Sunnie Nielsen, DO

## 2019-07-18 NOTE — Patient Instructions (Addendum)
If you are age 47 or older, your body mass index should be between 23-30. Your Body mass index is 28.88 kg/m. If this is out of the aforementioned range listed, please consider follow up with your Primary Care Provider.  If you are age 39 or younger, your body mass index should be between 19-25. Your Body mass index is 28.88 kg/m. If this is out of the aformentioned range listed, please consider follow up with your Primary Care Provider.   Please go to the lab at [facility] ([address] [place] You will need to go to level "B", you do not need an appointment for this. Hours available are 7:30 am - 4:30 pm.   Record weight daily and bring to follow up appointment.   Please purchase the following medications over the counter and take as directed: Vitamin E 400 IU once daily.   2 cups of coffee per day.   Start exercising like walking 30 minutes per day.  Change diet to more grilled foods. Lose 6 lbs over next 12 weeks.   You have been scheduled for a colonoscopy. Please follow written instructions given to you at your visit today.  Please pick up your prep supplies at the pharmacy within the next 1-3 days. If you use inhalers (even only as needed), please bring them with you on the day of your procedure. Your physician has requested that you go to www.startemmi.com and enter the access code given to you at your visit today. This web site gives a general overview about your procedure. However, you should still follow specific instructions given to you by our office regarding your preparation for the procedure.   Follow up in 12 weeks.   Thank you,  Dr. Lynann Bologna

## 2019-07-19 ENCOUNTER — Encounter: Payer: Self-pay | Admitting: Gastroenterology

## 2019-07-21 ENCOUNTER — Encounter: Payer: Self-pay | Admitting: Physical Therapy

## 2019-07-21 ENCOUNTER — Ambulatory Visit (INDEPENDENT_AMBULATORY_CARE_PROVIDER_SITE_OTHER): Payer: BC Managed Care – PPO | Admitting: Physical Therapy

## 2019-07-21 ENCOUNTER — Other Ambulatory Visit: Payer: Self-pay

## 2019-07-21 DIAGNOSIS — G8929 Other chronic pain: Secondary | ICD-10-CM | POA: Diagnosis not present

## 2019-07-21 DIAGNOSIS — M25562 Pain in left knee: Secondary | ICD-10-CM | POA: Diagnosis not present

## 2019-07-21 DIAGNOSIS — M6281 Muscle weakness (generalized): Secondary | ICD-10-CM

## 2019-07-21 NOTE — Therapy (Signed)
[facility] [place] [address] [place], [place], [address] Phone: [phone]   Fax:  [phone]  Physical Therapy Treatment  Patient Details  Name: Danielle Nelson MRN: [id number] Date of Birth: 1970-10-08 Referring Provider (PT): Dr Clementeen Graham     Encounter Date: 07/21/2019  PT End of Session - 07/21/19 0803    Visit Number  6    Number of Visits  12    Date for PT Re-Evaluation  08/15/19    PT Start Time  0805    PT Stop Time  0854   last 10 min CP   PT Time Calculation (min)  49 min       Past Medical History:  Diagnosis Date  . IBS (irritable bowel syndrome)   . Lyme disease     Past Surgical History:  Procedure Laterality Date  . BREAST BIOPSY Left   . FLEXIBLE SIGMOIDOSCOPY     was told she has ibs and gluten sensitivity. around 2008    There were no vitals filed for this visit.  Subjective Assessment - 07/21/19 0805    Subjective Latora states she had Lt knee discomfort over the weekend. She reports feeling a lateral shift in the Lt knee when descending stairs. She voiced concerns about a boney prominence in her Lt knee; similar structure on Rt but more pronounced on Lt and it is very sensitive now, along with some throbbing.    Pertinent History  multiple joint pain following Lyme's disease 2003 - continues to have constant joint pain    Currently in Pain?  Yes    Pain Score  2     Pain Location  Knee    Pain Orientation  Left    Pain Descriptors / Indicators  Discomfort;Other (Comment)   annoying pain   Aggravating Factors   descending stairs         [facility] PT Assessment - 07/21/19 0001      Assessment   Medical Diagnosis  Lt knee pain; patellofemoral pain     Referring Provider (PT)  Dr Clementeen Graham      Onset Date/Surgical Date  01/15/20    Hand Dominance  Right    Next MD Visit  PRN     Prior Therapy  18 years ago          [facility] Adult PT Treatment/Exercise - 07/21/19 0001      Knee/Hip  Exercises: Stretches   Lobbyist  Left;3 reps;30 seconds      Knee/Hip Exercises: Aerobic   Recumbent Bike  L2, 4 mins   up to 5/10 pain at end of exercise     Knee/Hip Exercises: Standing   Forward Lunges  Right;Left;2 sets;10 reps   at railing, push off back foot to SL balance-warrior 3   Forward Lunges Limitations  discomfort in Lt knee, form correction relieved mildly    Functional Squat  2 sets;10 reps   slow descent squats for count of 3 in 3 levels, up and down     Knee/Hip Exercises: Seated   Long Arc Quad  Strengthening;Left;1 set;10 reps;Weights    Long Arc Quad Weight  2 lbs.      Knee/Hip Exercises: Supine   Quad Sets  Strengthening;Left;1 set;5 reps   trial LE in ext rot-no pain x 2     Manual Therapy   Kinesiotex  Inhibit Muscle   dynamic tape to lat Lt knee in " K"; patellar stability  PT Long Term Goals - 07/21/19 0905      PT LONG TERM GOAL #1   Title  Increase LE functional strength with patient to report ability and stand and walk for 15-20 min with minimal to no increase in pain.    Time  6    Period  Weeks    Status  On-going      PT LONG TERM GOAL #2   Title  Decrease pain by 50-75% allowing patient to progress with ADL's    Time  6    Period  Weeks    Status  On-going      PT LONG TERM GOAL #3   Title  Patient to demonstrate increased tissue extensibility in hamstrings and piriformis musculature with mobility equal bilat    Time  6    Period  Weeks    Status  On-going      PT LONG TERM GOAL #4   Title  Independent in HEP    Time  6    Period  Weeks    Status  On-going      PT LONG TERM GOAL #5   Title  Improve FOTO to </= 37% limitation    Time  6    Period  Weeks    Status  On-going            Plan - 07/21/19 8416    Clinical Impression Statement Pt reported increased Lt knee pain up to  5/10 with bicycle. Pt had tenderness over lateral portion of Lt knee with palpation. Some Lt knee popping reported during  exercise, without increase in pain.  Pt's knee was retaped with continued positive response.  Pt is progressing towards all goals.    Stability/Clinical Decision Making  Stable/Uncomplicated    Rehab Potential  Good    PT Frequency  2x / week    PT Duration  6 weeks    PT Treatment/Interventions  Patient/family education;ADLs/Self Care Home Management;Cryotherapy;Electrical Stimulation;Iontophoresis 4mg /ml Dexamethasone;Moist Heat;Ultrasound;Therapeutic activities;Therapeutic exercise;Balance training;Neuromuscular re-education;Dry needling;Taping    PT Next Visit Plan  stretching and strengthening Lt LE; manual work and taping as indicated; assess response to manual work; SLS and balance LLE, SL strength via LAQ & SL wall squat, review HEP    PT Home Exercise Plan  Access Code: [email or web address]    Consulted and Agree with Plan of Care  Patient       Patient will benefit from skilled therapeutic intervention in order to improve the following deficits and impairments:     Visit Diagnosis: Chronic pain of left knee  Muscle weakness (generalized)     Problem List Patient Active Problem List   Diagnosis Date Noted  . Arthralgia 06/20/2019  . H/o Lyme disease 06/19/2019  . Onychomycosis due to dermatophyte 11/02/2016  . Urge incontinence 10/12/2013  . Bladder cystocele 09/29/2013  . Irritable bowel syndrome (IBS) 12/13/2012  . Perimenopausal symptoms 12/13/2012  . 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 Left cervical radiculopathy 03/18/2011  . Gluten intolerance 08/15/2010  . Allergic rhinitis 06/17/2010  . Rosacea 05/06/2007    Ronaldo Miyamoto, SPTA 07/21/19 9:42 AM  This entire session was performed under direct supervision and direction of a licensed Physical Brewing technologist. I have personally read, edited and approved of the note as written.  Kerin Perna, PTA 07/21/19 12:32 PM   [facility] [facility] [place], [place], [address] Phone: [phone]   Fax:  [phone]  Name: Matison Nuccio MRN: [id number]  Date of Birth: 1971/01/12

## 2019-07-24 ENCOUNTER — Other Ambulatory Visit: Payer: BC Managed Care – PPO

## 2019-07-24 DIAGNOSIS — R945 Abnormal results of liver function studies: Secondary | ICD-10-CM | POA: Diagnosis not present

## 2019-07-24 DIAGNOSIS — K580 Irritable bowel syndrome with diarrhea: Secondary | ICD-10-CM

## 2019-07-24 DIAGNOSIS — R7989 Other specified abnormal findings of blood chemistry: Secondary | ICD-10-CM

## 2019-07-24 DIAGNOSIS — R197 Diarrhea, unspecified: Secondary | ICD-10-CM | POA: Diagnosis not present

## 2019-07-25 ENCOUNTER — Ambulatory Visit (INDEPENDENT_AMBULATORY_CARE_PROVIDER_SITE_OTHER): Payer: BC Managed Care – PPO | Admitting: Rehabilitative and Restorative Service Providers"

## 2019-07-25 ENCOUNTER — Encounter: Payer: Self-pay | Admitting: Rehabilitative and Restorative Service Providers"

## 2019-07-25 ENCOUNTER — Other Ambulatory Visit: Payer: Self-pay

## 2019-07-25 DIAGNOSIS — M25562 Pain in left knee: Secondary | ICD-10-CM

## 2019-07-25 DIAGNOSIS — G8929 Other chronic pain: Secondary | ICD-10-CM

## 2019-07-25 DIAGNOSIS — M6281 Muscle weakness (generalized): Secondary | ICD-10-CM | POA: Diagnosis not present

## 2019-07-25 NOTE — Progress Notes (Signed)
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

## 2019-07-25 NOTE — Patient Instructions (Signed)
Access Code: [email or web address] URL: [email or web address] Date: 07/25/2019 Prepared by: Margretta Ditty  Exercises Standing Soleus Stretch - 2 x daily - 7 x weekly - 1 sets - 2 reps - 30 seconds hold Gastroc Stretch on Wall - 2 x daily - 7 x weekly - 1 sets - 2 reps - 30 seconds hold Hooklying Hamstring Stretch with Strap - 2 x daily - 7 x weekly - 2-3 reps - 1 sets - 30 sec hold Supine ITB Stretch with Strap - 1 x daily - 7 x weekly - 3 sets - 2-3 reps - 30 hold Prone Quadriceps Stretch with Strap - 2 x daily - 7 x weekly - 1 sets - 2-3 reps - 30 sec hold Straight Leg Raise with External Rotation - 2 x daily - 7 x weekly - 1 sets - 10 reps Supine Bridge - 1 x daily - 7 x weekly - 2 sets - 10 reps Squat with Chair Touch - 1 x daily - 7 x weekly - 1 sets - 10 reps Roller Massage Elongated IT Band Release - 1 x daily - 7 x weekly - 1 sets Supine Figure 4 Piriformis Stretch - 2 x daily - 7 x weekly - 1 sets - 2-3 reps - 30 hold

## 2019-07-25 NOTE — Therapy (Signed)
[facility] [place] [address] [place], [place], [address] Phone: [phone]   Fax:  [phone]  Physical Therapy Treatment  Patient Details  Name: Danielle Nelson MRN: [id number] Date of Birth: 1971/02/27 Referring Provider (PT): Dr Clementeen Graham     Encounter Date: 07/25/2019  PT End of Session - 07/25/19 0808    Visit Number  7    Number of Visits  12    Date for PT Re-Evaluation  08/15/19    PT Start Time  0804    PT Stop Time  0845    PT Time Calculation (min)  41 min    Activity Tolerance  Patient tolerated treatment well    Behavior During Therapy  [facility] for tasks assessed/performed       Past Medical History:  Diagnosis Date  . IBS (irritable bowel syndrome)   . Lyme disease     Past Surgical History:  Procedure Laterality Date  . BREAST BIOPSY Left   . FLEXIBLE SIGMOIDOSCOPY     was told she has ibs and gluten sensitivity. around 2008    There were no vitals filed for this visit.  Subjective Assessment - 07/25/19 0805    Subjective  The patient reports she continues with 3/10 pain worse when going up and down steps with L knee.  She also has new onset numbness with L lateral border of foot.  She has low back pain that is also worse this week.    Pertinent History  multiple joint pain following Lyme's disease 2003 - continues to have constant joint pain    Patient Stated Goals  get rid of pain in the knee    Currently in Pain?  Yes    Pain Score  3     Pain Location  Knee    Pain Orientation  Left    Pain Descriptors / Indicators  Discomfort    Pain Type  Chronic pain    Pain Onset  More than a month ago    Pain Frequency  Intermittent    Aggravating Factors   stairs    Pain Relieving Factors  taping    Multiple Pain Sites  Yes    Pain Score  6    Pain Location  Back    Pain Orientation  Right;Lower    Pain Descriptors / Indicators  Aching;Discomfort    Pain Type  Acute pain    Pain Radiating Towards   unsure if related to L lateral border of foot numbness    Pain Frequency  Intermittent    Aggravating Factors   driving                       [facility] Adult PT Treatment/Exercise - 07/25/19 0813      Exercises   Exercises  Knee/Hip      Knee/Hip Exercises: Stretches   ITB Stretch  Left;2 reps;30 seconds    Piriformis Stretch  2 reps;Right;Left;30 seconds      Knee/Hip Exercises: Aerobic   Nustep  L4 x 4 minutes      Knee/Hip Exercises: Standing   Forward Lunges  Left    Forward Lunges Limitations  10 reps split lunges; tried alternating lunges and patient has increased pain with dynamic lunges    Side Lunges  10 reps    Side Lunges Limitations  side stepping squats    Step Down  Right;Left;10 reps    Step Down Limitations  from 4" step  with cues to keep hips level    Wall Squat  1 set;10 reps;10 seconds   isometric holds while squeezing ball     Knee/Hip Exercises: Supine   Straight Leg Raise with External Rotation  Strengthening;Left;10 reps      Manual Therapy   Manual Therapy  Soft tissue mobilization;Taping    Manual therapy comments  to reduce L IT band tightness and tape for patellar tracking    Soft tissue mobilization  STM lateral patellar ligaments and sidelying (R) for L ITB STM    Kinesiotex  Inhibit Muscle             PT Education - 07/25/19 0847    Education Details  HEP- modified to add ER with SLR for VMO    Person(s) Educated  Patient    Methods  Explanation;Demonstration;Handout    Comprehension  Verbalized understanding;Returned demonstration          PT Long Term Goals - 07/21/19 0905      PT LONG TERM GOAL #1   Title  Increase LE functional strength with patient to report ability and stand and walk for 15-20 min with minimal to no increase in pain.    Time  6    Period  Weeks    Status  On-going      PT LONG TERM GOAL #2   Title  Decrease pain by 50-75% allowing patient to progress with ADL's    Time  6    Period   Weeks    Status  On-going      PT LONG TERM GOAL #3   Title  Patient to demonstrate increased tissue extensibility in hamstrings and piriformis musculature with mobility equal bilat    Time  6    Period  Weeks    Status  On-going      PT LONG TERM GOAL #4   Title  Independent in HEP    Time  6    Period  Weeks    Status  On-going      PT LONG TERM GOAL #5   Title  Improve FOTO to </= 37% limitation    Time  6    Period  Weeks    Status  On-going            Plan - 07/25/19 1014    Clinical Impression Statement  The patient has continued lateral thigh tightness and pain with STM of lateral patellar ligaments.  She also reports h/o back pain that is worse this week with increased awareness of L lateral foot numbness.  PT continued to progress strengthening in clinic, emphasized ITB stretching for home, and added taping for support.  Continue to progress to LTGs.    Stability/Clinical Decision Making  Stable/Uncomplicated    Rehab Potential  Good    PT Frequency  2x / week    PT Duration  6 weeks    PT Treatment/Interventions  Patient/family education;ADLs/Self Care Home Management;Cryotherapy;Electrical Stimulation;Iontophoresis 4mg /ml Dexamethasone;Moist Heat;Ultrasound;Therapeutic activities;Therapeutic exercise;Balance training;Neuromuscular re-education;Dry needling;Taping    PT Next Visit Plan  stretching and strengthening Lt LE; manual work and taping as indicated; assess response to manual work; SLS and balance LLE, SL strength via LAQ & SL wall squat, review HEP    PT Home Exercise Plan  Access Code: [email or web address]    Consulted and Agree with Plan of Care  Patient       Patient will benefit from skilled therapeutic intervention in order to improve the following deficits  and impairments:     Visit Diagnosis: Chronic pain of left knee  Muscle weakness (generalized)     Problem List Patient Active Problem List   Diagnosis Date Noted  . Arthralgia 06/20/2019  . H/o  Lyme disease 06/19/2019  . Onychomycosis due to dermatophyte 11/02/2016  . Urge incontinence 10/12/2013  . Bladder cystocele 09/29/2013  . Irritable bowel syndrome (IBS) 12/13/2012  . Perimenopausal symptoms 12/13/2012  . 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 Left cervical radiculopathy 03/18/2011  . Gluten intolerance 08/15/2010  . Allergic rhinitis 06/17/2010  . Rosacea 05/06/2007    Booker Bhatnagar, PT 07/25/2019, 10:17 AM  [facility] [address] [place], [place], [address] Phone: [phone]   Fax:  [phone]  Name: Danielle Nelson MRN: [id number] Date of Birth: 10/30/1970

## 2019-07-26 LAB — CELIAC PANEL 10
Antigliadin Abs, IgA: 6 units (ref 0–19)
Endomysial IgA: NEGATIVE
Gliadin IgG: 1 units (ref 0–19)
IgA/Immunoglobulin A, Serum: 208 mg/dL (ref 87–352)
Tissue Transglut Ab: <2 U/mL (ref 0–5)
Transglutaminase IgA: <2 U/mL (ref 0–3)

## 2019-07-27 ENCOUNTER — Encounter: Payer: BC Managed Care – PPO | Admitting: Obstetrics and Gynecology

## 2019-07-27 LAB — HEPATITIS A ANTIBODY, TOTAL: Hepatitis A AB,Total: REACTIVE — AB

## 2019-07-27 LAB — MITOCHONDRIAL ANTIBODIES: Mitochondrial M2 Ab, IgG: 55.3 U — ABNORMAL HIGH

## 2019-07-27 LAB — HEPATITIS B SURFACE ANTIBODY,QUALITATIVE: Hep B S Ab: NONREACTIVE

## 2019-07-27 LAB — ANTI-SMOOTH MUSCLE ANTIBODY, IGG: Actin (Smooth Muscle) Antibody (IGG): <20 U (ref <20)

## 2019-07-27 LAB — ALPHA-1-ANTITRYPSIN: A-1 Antitrypsin, Ser: 160 mg/dL (ref 83–199)

## 2019-07-27 LAB — CERULOPLASMIN: Ceruloplasmin: 29 mg/dL (ref 18–53)

## 2019-07-28 ENCOUNTER — Telehealth: Payer: Self-pay | Admitting: Gastroenterology

## 2019-07-28 ENCOUNTER — Ambulatory Visit: Payer: BC Managed Care – PPO | Admitting: Rehabilitative and Restorative Service Providers"

## 2019-07-28 ENCOUNTER — Encounter: Payer: Self-pay | Admitting: Rehabilitative and Restorative Service Providers"

## 2019-07-28 ENCOUNTER — Other Ambulatory Visit: Payer: Self-pay

## 2019-07-28 DIAGNOSIS — M25562 Pain in left knee: Secondary | ICD-10-CM | POA: Diagnosis not present

## 2019-07-28 DIAGNOSIS — M6281 Muscle weakness (generalized): Secondary | ICD-10-CM | POA: Diagnosis not present

## 2019-07-28 DIAGNOSIS — G8929 Other chronic pain: Secondary | ICD-10-CM | POA: Diagnosis not present

## 2019-07-28 NOTE — Therapy (Signed)
[place], [place], [address] Phone: [phone]   Fax:  [phone]  Physical Therapy Treatment  Patient Details  Name: Danielle Nelson MRN: [id number] Date of Birth: [birth year] Referring Provider (PT): Dr Lynne Leader     Encounter Date: 07/28/2019  PT End of Session - 07/28/19 0804    Visit Number  8    Number of Visits  12    Date for PT Re-Evaluation  08/15/19    PT Start Time  0803    PT Stop Time  0852    PT Time Calculation (min)  49 min       Past Medical History:  Diagnosis Date  . IBS (irritable bowel syndrome)   . Lyme disease     Past Surgical History:  Procedure Laterality Date  . BREAST BIOPSY Left   . FLEXIBLE SIGMOIDOSCOPY     was told she has ibs and gluten sensitivity. around 2008    There were no vitals filed for this visit.  Subjective Assessment - 07/28/19 0806    Subjective  Patient reports that she is having severe Rt sided LBP - not sure if she is having pain from the stretches. She does not know of anything different she has done that may have irritated the LB. Pt reports improvement in back pain following treatment.    Currently in Pain?  Yes    Pain Score  2     Pain Location  Knee    Pain Orientation  Left    Pain Descriptors / Indicators  Discomfort    Pain Type  Chronic pain    Pain Radiating Towards  no longer having pain into the calf or leg    Pain Score  8    Pain Location  Back    Pain Orientation  Right;Lower    Pain Descriptors / Indicators  Sharp;Constant    Pain Type  Acute pain    Pain Radiating Towards  has had LBP in the past but this is different - Rt sided and higher    Pain Frequency  Constant                        [facility] Adult PT Treatment/Exercise - 07/28/19 0001      Exercises   Exercises  Lumbar      Lumbar Exercises: Stretches   Other Lumbar Stretch Exercise  Cobra x 5 reps, 2 sec hold    Other Lumbar Stretch  Exercise  Knee to chest x 3 reps, 30 sec      Knee/Hip Exercises: Stretches   Gastroc Stretch  30 seconds;Both;2 reps      Knee/Hip Exercises: Aerobic   Recumbent Bike  L2, 4 mins      Modalities   Modalities  Teacher, English as a foreign language Location  R thoracolumbar paraspinals    Electrical Stimulation Action  TENS    Electrical Stimulation Parameters  To tolerance    Electrical Stimulation Goals  Pain;Tone      Manual Therapy   Manual Therapy  Soft tissue mobilization    Soft tissue mobilization  deep tissue work through thoracolumbar paraspinals and lats    Myofascial Release  Right thoracolumbar region             PT Education - 07/28/19 0843    Education Details  HEP modified gastroc  PT Long Term Goals - 07/21/19 0905      PT LONG TERM GOAL #1   Title  Increase LE functional strength with patient to report ability and stand and walk for 15-20 min with minimal to no increase in pain.    Time  6    Period  Weeks    Status  On-going      PT LONG TERM GOAL #2   Title  Decrease pain by 50-75% allowing patient to progress with ADL's    Time  6    Period  Weeks    Status  On-going      PT LONG TERM GOAL #3   Title  Patient to demonstrate increased tissue extensibility in hamstrings and piriformis musculature with mobility equal bilat    Time  6    Period  Weeks    Status  On-going      PT LONG TERM GOAL #4   Title  Independent in HEP    Time  6    Period  Weeks    Status  On-going      PT LONG TERM GOAL #5   Title  Improve FOTO to </= 37% limitation    Time  6    Period  Weeks    Status  On-going            Plan - 07/28/19 0830    Clinical Impression Statement  Patient has continuous right LBP on presentation to clinic today. Assessment found pt to have shortened/tight and tender thoracolumbar paraspinals and lats -- pain improved upon stretching and manual therapy from 8/10 to 6/10.     Rehab Potential  Good    PT Frequency  2x / week    PT Duration  6 weeks    PT Treatment/Interventions  Patient/family education;ADLs/Self Care Home Management;Cryotherapy;Electrical Stimulation;Iontophoresis 4mg /ml Dexamethasone;Moist Heat;Ultrasound;Therapeutic activities;Therapeutic exercise;Balance training;Neuromuscular re-education;Dry needling;Taping    PT Next Visit Plan  Consider progressing thoracolumbar and lat stretching and dry needling if needed; manual therapy as indicated; Assess changing exercise plan    PT Home Exercise Plan  Access Code: [email or web address] and Access Code: [email or web address]    Consulted and Agree with Plan of Care  Patient       Patient will benefit from skilled therapeutic intervention in order to improve the following deficits and impairments:     Visit Diagnosis: Chronic pain of left knee  Muscle weakness (generalized)     Problem List Patient Active Problem List   Diagnosis Date Noted  . Arthralgia 06/20/2019  . H/o Lyme disease 06/19/2019  . Onychomycosis due to dermatophyte 11/02/2016  . Urge incontinence 10/12/2013  . Bladder cystocele 09/29/2013  . Irritable bowel syndrome (IBS) 12/13/2012  . Perimenopausal symptoms 12/13/2012  . 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 Left cervical radiculopathy 03/18/2011  . Gluten intolerance 08/15/2010  . Allergic rhinitis 06/17/2010  . Rosacea 05/06/2007    Danielle Nelson 05/08/2007 PT, MPH  07/28/2019, 8:49 AM  [facility] [address] [place], [place], [place] Phone: [phone]   Fax:  [phone]  Name: Danielle Nelson MRN: [name] Date of Birth: 02/10/1971

## 2019-07-28 NOTE — Patient Instructions (Signed)
Access Code: [email or web address] URL: [email or web address] Date: 07/28/2019 Prepared by: Corlis Leak  Exercises Cobra - 2 x daily - 7 x weekly - 3 reps - 1 sets - 30 sec hold Supine Double Knee to Chest - 2 x daily - 7 x weekly - 3 reps - 1 sets - 30 sec hold Supine Double Knee to Chest Modified - 2 x daily - 7 x weekly - 1 sets - 3 reps - 30 sec hold  Patient Education TENS Unit

## 2019-07-31 ENCOUNTER — Encounter: Payer: Self-pay | Admitting: Gastroenterology

## 2019-07-31 ENCOUNTER — Ambulatory Visit: Payer: BC Managed Care – PPO | Admitting: Family Medicine

## 2019-07-31 ENCOUNTER — Encounter: Payer: BC Managed Care – PPO | Admitting: Gastroenterology

## 2019-07-31 ENCOUNTER — Other Ambulatory Visit: Payer: Self-pay

## 2019-07-31 DIAGNOSIS — R7989 Other specified abnormal findings of blood chemistry: Secondary | ICD-10-CM

## 2019-07-31 NOTE — Telephone Encounter (Signed)
LVM  Patient instructions are ready for procedure date 09-11-2019. Patient can pick up instructions at [place] or view it on mychart

## 2019-08-01 NOTE — Telephone Encounter (Signed)
Called patient today and left voicemail to call back  Patient needs to reschedule hep B injection in 2 weeks due to no vaccine in the clinic-please reschedule her

## 2019-08-03 ENCOUNTER — Other Ambulatory Visit: Payer: Self-pay

## 2019-08-03 ENCOUNTER — Encounter: Payer: Self-pay | Admitting: Rehabilitative and Restorative Service Providers"

## 2019-08-03 ENCOUNTER — Ambulatory Visit (INDEPENDENT_AMBULATORY_CARE_PROVIDER_SITE_OTHER): Payer: BC Managed Care – PPO | Admitting: Rehabilitative and Restorative Service Providers"

## 2019-08-03 DIAGNOSIS — M6281 Muscle weakness (generalized): Secondary | ICD-10-CM

## 2019-08-03 DIAGNOSIS — M25562 Pain in left knee: Secondary | ICD-10-CM

## 2019-08-03 DIAGNOSIS — G8929 Other chronic pain: Secondary | ICD-10-CM

## 2019-08-03 NOTE — Patient Instructions (Signed)

## 2019-08-03 NOTE — Therapy (Addendum)
[place], [place], [address] Phone: [phone]   Fax:  [phone]  Physical Therapy Treatment  Patient Details  Name: Danielle Nelson MRN: [id number] Date of Birth: August 21, 1970 Referring Provider (PT): Dr Lynne Leader     Encounter Date: 08/03/2019  PT End of Session - 08/03/19 0805    Visit Number  9    Number of Visits  12    Date for PT Re-Evaluation  08/15/19    PT Start Time  0804    PT Stop Time  6122    PT Time Calculation (min)  54 min       Past Medical History:  Diagnosis Date  . IBS (irritable bowel syndrome)   . Lyme disease     Past Surgical History:  Procedure Laterality Date  . BREAST BIOPSY Left   . FLEXIBLE SIGMOIDOSCOPY     was told she has ibs and gluten sensitivity. around 2008    There were no vitals filed for this visit.  Subjective Assessment - 08/03/19 0805    Subjective  Back is feeling much better. Knee is better as well. She has some tingling in the outside top of the Lt foot which is there all the time. Worried about a "pinched nerve". She has had some pain in the LB at times when she is in certain positions - sitting on the sofa last night. Doing more with less pain in the knee - no pain with nustep today.    Currently in Pain?  No/denies    Pain Score  0                        [facility] Adult PT Treatment/Exercise - 08/03/19 0001      Knee/Hip Exercises: Stretches   Passive Hamstring Stretch  Left;2 reps;20 seconds   supine with strap    Quad Stretch  Left;1 rep;30 seconds   prone    ITB Stretch  Left;2 reps;30 seconds   supine with strap    ITB Stretch Limitations  repeated with eversion of Lt ankle     Gastroc Stretch  30 seconds;Both;2 reps    Gastroc Stretch Limitations  repeated after DN - pt reports change in tingling lateral Lt foot - moved toward ball of foot       Knee/Hip Exercises: Aerobic   Nustep  L5 x 6 min UE 9       Moist  Heat Therapy   Number Minutes Moist Heat  10 Minutes    Moist Heat Location  Lumbar Spine   Lt knee and lateral calf      Electrical Stimulation   Electrical Stimulation Location  lateral Lt calf     Electrical Stimulation Action  TENs    Electrical Stimulation Parameters  to tolerance    Electrical Stimulation Goals  Pain;Tone      Manual Therapy   Manual Therapy  Soft tissue mobilization    Manual therapy comments  skilled palpation to assess soft tissue response to DN     Soft tissue mobilization  deep tissue work through lateral Lt calf     Myofascial Release  lateral Lt calf     Passive ROM  PROM Lt hip and knee        Trigger Point Dry Needling - 08/03/19 0001    Consent Given?  Yes    Education Handout Provided  Yes    Dry Needling Comments  Lt - 4 needles - report of slight nausea     Anterior tibialis Response  Palpable increased muscle length           PT Education - 08/03/19 0835    Education Details  DN    Person(s) Educated  Patient    Methods  Explanation;Handout    Comprehension  Verbalized understanding          PT Long Term Goals - 07/21/19 0905      PT LONG TERM GOAL #1   Title  Increase LE functional strength with patient to report ability and stand and walk for 15-20 min with minimal to no increase in pain.    Time  6    Period  Weeks    Status  On-going      PT LONG TERM GOAL #2   Title  Decrease pain by 50-75% allowing patient to progress with ADL's    Time  6    Period  Weeks    Status  On-going      PT LONG TERM GOAL #3   Title  Patient to demonstrate increased tissue extensibility in hamstrings and piriformis musculature with mobility equal bilat    Time  6    Period  Weeks    Status  On-going      PT LONG TERM GOAL #4   Title  Independent in HEP    Time  6    Period  Weeks    Status  On-going      PT LONG TERM GOAL #5   Title  Improve FOTO to </= 37% limitation    Time  6    Period  Weeks    Status  On-going             Plan - 08/03/19 1025    Clinical Impression Statement  Patient reports decreased pain in the LB and Lt knee. She has some persistent tingling in the lt lateral foot. Trial of DN through tightness in the ant tib with some change but not full resolution in tingling. Added eversion with ITB stretch to address ant tib tightness. Progressing well with rehab. Encouraged consistent HEP.    Rehab Potential  Good    PT Frequency  2x / week    PT Duration  6 weeks    PT Treatment/Interventions  Patient/family education;ADLs/Self Care Home Management;Cryotherapy;Electrical Stimulation;Iontophoresis 40m/ml Dexamethasone;Moist Heat;Ultrasound;Therapeutic activities;Therapeutic exercise;Balance training;Neuromuscular re-education;Dry needling;Taping    PT Next Visit Plan  Consider progressing thoracolumbar and lat stretching and dry needling if needed if pt has continued LBP; manual therapy as indicated; Assess response to DN Lt ant tib    PT Home Exercise Plan  Access Code: [email or web address] Access Code: [email or web address]    Consulted and Agree with Plan of Care  Patient       Patient will benefit from skilled therapeutic intervention in order to improve the following deficits and impairments:     Visit Diagnosis: Chronic pain of left knee  Muscle weakness (generalized)     Problem List Patient Active Problem List   Diagnosis Date Noted  . Arthralgia 06/20/2019  . H/o Lyme disease 06/19/2019  . Onychomycosis due to dermatophyte 11/02/2016  . Urge incontinence 10/12/2013  . Bladder cystocele 09/29/2013  . Irritable bowel syndrome (IBS) 12/13/2012  . Perimenopausal symptoms 12/13/2012  . 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 Left cervical radiculopathy 03/18/2011  . Gluten intolerance 08/15/2010  . Allergic rhinitis 06/17/2010  . Rosacea 05/06/2007    Bhavin Monjaraz  Nilda Simmer PT, MPH  08/03/2019, 10:17 AM  [facility] [place],  [place], [address] Phone: [phone]   Fax:  [phone]  Name: Amiracle Neises MRN: [id number] Date of Birth: [birth year]  Addendum: patient called and PT returned call - Verlyn stated that she was having cramping in the back of her calf and having difficulty with walking. Advised patient to use moist heat; continue stretching; avoid walking on her toes; keep moving; drink a lot of water; call tomorrow if she has any further questions.   Kerisha Goughnour P. Helene Kelp PT, MPH 08/03/19 6:18 PM  PHYSICAL THERAPY DISCHARGE SUMMARY  Visits from Start of Care: 9  Current functional level related to goals / functional outcomes: See progress note for discharge status    Remaining deficits: Unknown    Education / Equipment: HEP  Plan: Patient agrees to discharge.  Patient goals were partially met. Patient is being discharged due to                                                     ?????    Patient will continue care for pelvic floor dysfunction.   Sandhya Denherder P. Helene Kelp PT, MPH 10/23/19 8:10 AM '

## 2019-08-04 ENCOUNTER — Telehealth: Payer: Self-pay | Admitting: Rehabilitative and Restorative Service Providers"

## 2019-08-04 NOTE — Telephone Encounter (Signed)
TC to patient to check on tingling she experienced in foot. The original tingling resolved with DN but now she has tingling in a different area of the foot. She was able to go for her regular walk this am without difficulty. Discussed de-sentization techniques and suggested patient monitor symptoms over the weekend and contact MD next week if she continues to have concerns.  Celyn P. Leonor Liv PT, MPH 08/04/19 2:44 PM

## 2019-08-07 ENCOUNTER — Other Ambulatory Visit: Payer: Self-pay | Admitting: Obstetrics & Gynecology

## 2019-08-07 ENCOUNTER — Encounter: Payer: Self-pay | Admitting: Obstetrics & Gynecology

## 2019-08-07 ENCOUNTER — Ambulatory Visit (INDEPENDENT_AMBULATORY_CARE_PROVIDER_SITE_OTHER): Payer: BC Managed Care – PPO | Admitting: Obstetrics & Gynecology

## 2019-08-07 ENCOUNTER — Other Ambulatory Visit: Payer: Self-pay

## 2019-08-07 ENCOUNTER — Other Ambulatory Visit (HOSPITAL_COMMUNITY)
Admission: RE | Admit: 2019-08-07 | Discharge: 2019-08-07 | Disposition: A | Discharge status: Home / Self Care | Payer: BC Managed Care – PPO | Source: Ambulatory Visit | Attending: Obstetrics & Gynecology | Admitting: Obstetrics & Gynecology

## 2019-08-07 VITALS — BP 122/78 | HR 97 | Resp 16 | Ht 64.0 in | Wt 168.0 lb

## 2019-08-07 DIAGNOSIS — Z1272 Encounter for screening for malignant neoplasm of vagina: Secondary | ICD-10-CM

## 2019-08-07 DIAGNOSIS — Z01419 Encounter for gynecological examination (general) (routine) without abnormal findings: Secondary | ICD-10-CM | POA: Insufficient documentation

## 2019-08-07 DIAGNOSIS — F419 Anxiety disorder, unspecified: Secondary | ICD-10-CM

## 2019-08-07 DIAGNOSIS — F341 Dysthymic disorder: Secondary | ICD-10-CM | POA: Diagnosis not present

## 2019-08-07 DIAGNOSIS — R3982 Chronic bladder pain: Secondary | ICD-10-CM

## 2019-08-07 DIAGNOSIS — R350 Frequency of micturition: Secondary | ICD-10-CM

## 2019-08-07 DIAGNOSIS — N898 Other specified noninflammatory disorders of vagina: Secondary | ICD-10-CM | POA: Diagnosis not present

## 2019-08-07 DIAGNOSIS — R635 Abnormal weight gain: Secondary | ICD-10-CM | POA: Diagnosis not present

## 2019-08-07 DIAGNOSIS — Z1231 Encounter for screening mammogram for malignant neoplasm of breast: Secondary | ICD-10-CM

## 2019-08-07 DIAGNOSIS — Z01411 Encounter for gynecological examination (general) (routine) with abnormal findings: Secondary | ICD-10-CM | POA: Diagnosis not present

## 2019-08-07 NOTE — Patient Instructions (Signed)
[email or web address] Freeze dried   Meditation--true training, not just head space  Cognitive Behavioral therapy  Pelvic PT for bladder retraining

## 2019-08-07 NOTE — Progress Notes (Signed)
GAD: 5  PHQ: 11

## 2019-08-07 NOTE — Progress Notes (Signed)
Subjective:     Danielle Nelson is a 49 y.o. female here for a routine exam.  Current complaints: urinary frequency and plans travel schedule around bathrooms.  Saw Dr. Logan Bores in 2015 for extreme frequency (note in Epic).  She was confused as some of the therapies were invasive and she didn't feel like she had a clear diagnosis.  Pt suffers from chronic Lynme with joint pains.  Pt has depression symptoms (PHQ-9 = 11, GAD = 5).     Gynecologic History Patient's last menstrual period was 07/22/2019. Contraception: none Last Pap: 2015. Results were: normal Last mammogram: 2014. Results were: normal  Obstetric History OB History  Gravida Para Term Preterm AB Living  2 2 2         SAB TAB Ectopic Multiple Live Births          2    # Outcome Date GA Lbr Len/2nd Weight Sex Delivery Anes PTL Lv  2 Term      Vag-Spont     1 Term      Vag-Spont        The following portions of the patient's history were reviewed and updated as appropriate: allergies, current medications, past family history, past medical history, past social history, past surgical history and problem list.  Review of Systems Pertinent items noted in HPI and remainder of comprehensive ROS otherwise negative.    Objective:      Vitals:   08/07/19 0913  BP: 122/78  Pulse: 97  Resp: 16  Weight: 168 lb (76.2 kg)  Height: 5\' 4"  (1.626 m)   Vitals:  WNL General appearance: alert, cooperative and no distress  HEENT: Normocephalic, without obvious abnormality, atraumatic Eyes: negative Throat: lips, mucosa, and tongue normal; teeth and gums normal  Respiratory: Clear to auscultation bilaterally  CV: Regular rate and rhythm  Breasts:  Normal appearance, no masses or tenderness, no nipple retraction or dimpling  GI: Soft, non-tender; bowel sounds normal; no masses,  no organomegaly  GU: External Genitalia:  Tanner V, no lesion Urethra:  No prolapse Bladder:  Mild cystocele  Vagina: Pink, normal rugae, no blood or discharge   Cervix: No CMT, no lesion  Uterus:  Non tender, limited exam due to habitus  Adnexa: Non tender, limited exam due to habitus  Musculoskeletal: No edema, redness or tenderness in the calves or thighs  Skin: No lesions or rash  Lymphatic: Axillary adenopathy: none     Psychiatric: Normal mood and behavior   Assessment:    Healthy female exam.    Plan:   1.  Pap with cotesting 2.  Yearly mammograms 3.  Colonoscopy at age 83 or per PCP 4.  Bladder pain and frequency--referral to PT for bladder retraining.  Also suggest CBT to help with anxiety associated with ADLs and needing to urinate.  Also suggest Freeze dried aloe vera to help with bladder symptoms.  5.  Depression--Pt would like to try counseling first before medicaitons.  We discussed the benefits of exercise on mood.  Pt has chronic joint pain so is limited by weigh bearing activities.  I suggested swimming or water aerobics.  Pt enjoys being outside.  Pt could benefit form meditation. 6.  Weight gain--TSH is normal; getting GI work up for elevated LFTs.  Will check HgbA1C  7.  RTC 12 weeks

## 2019-08-08 LAB — CERVICOVAGINAL ANCILLARY ONLY
Bacterial Vaginitis (gardnerella): NEGATIVE
Candida Glabrata: NEGATIVE
Candida Vaginitis: NEGATIVE
Chlamydia: NEGATIVE
Comment: NEGATIVE
Comment: NEGATIVE
Comment: NEGATIVE
Comment: NEGATIVE
Comment: NEGATIVE
Comment: NORMAL
Neisseria Gonorrhea: NEGATIVE
Trichomonas: NEGATIVE

## 2019-08-08 LAB — CYTOLOGY - PAP
Comment: NEGATIVE
Diagnosis: NEGATIVE
High risk HPV: NEGATIVE

## 2019-08-08 LAB — HEMOGLOBIN A1C
Hgb A1c MFr Bld: 5.9 % of total Hgb — ABNORMAL HIGH (ref <5.7)
Mean Plasma Glucose: 123 (calc)
eAG (mmol/L): 6.8 (calc)

## 2019-08-09 ENCOUNTER — Ambulatory Visit: Payer: BC Managed Care – PPO | Admitting: Gastroenterology

## 2019-08-09 DIAGNOSIS — Z23 Encounter for immunization: Secondary | ICD-10-CM

## 2019-08-09 DIAGNOSIS — Z1231 Encounter for screening mammogram for malignant neoplasm of breast: Secondary | ICD-10-CM

## 2019-08-09 MED ORDER — HEPATITIS B VAC RECOMBINANT 10 MCG/ML IJ SUSP: 1.0000 mL | Freq: Once | INTRAMUSCULAR | Status: DC

## 2019-08-10 ENCOUNTER — Other Ambulatory Visit: Payer: Self-pay

## 2019-08-10 NOTE — Progress Notes (Signed)
Erroneous encounter

## 2019-08-11 ENCOUNTER — Other Ambulatory Visit: Payer: Self-pay | Admitting: Radiology

## 2019-08-11 ENCOUNTER — Other Ambulatory Visit: Payer: Self-pay | Admitting: Student

## 2019-08-15 ENCOUNTER — Encounter (HOSPITAL_COMMUNITY): Payer: Self-pay

## 2019-08-15 ENCOUNTER — Ambulatory Visit (HOSPITAL_COMMUNITY)
Admission: RE | Admit: 2019-08-15 | Discharge: 2019-08-15 | Disposition: A | Discharge status: Home / Self Care | Payer: BC Managed Care – PPO | Source: Ambulatory Visit | Attending: Gastroenterology | Admitting: Gastroenterology

## 2019-08-15 ENCOUNTER — Other Ambulatory Visit: Payer: Self-pay | Admitting: Physician Assistant

## 2019-08-15 ENCOUNTER — Other Ambulatory Visit: Payer: Self-pay

## 2019-08-15 DIAGNOSIS — K760 Fatty (change of) liver, not elsewhere classified: Secondary | ICD-10-CM | POA: Insufficient documentation

## 2019-08-15 DIAGNOSIS — R7989 Other specified abnormal findings of blood chemistry: Secondary | ICD-10-CM

## 2019-08-15 DIAGNOSIS — K7402 Hepatic fibrosis, advanced fibrosis: Secondary | ICD-10-CM | POA: Diagnosis not present

## 2019-08-15 DIAGNOSIS — R945 Abnormal results of liver function studies: Secondary | ICD-10-CM | POA: Diagnosis not present

## 2019-08-15 DIAGNOSIS — K7400 Hepatic fibrosis, unspecified: Secondary | ICD-10-CM | POA: Diagnosis not present

## 2019-08-15 DIAGNOSIS — K742 Hepatic fibrosis with hepatic sclerosis: Secondary | ICD-10-CM | POA: Diagnosis not present

## 2019-08-15 DIAGNOSIS — K7581 Nonalcoholic steatohepatitis (NASH): Secondary | ICD-10-CM | POA: Diagnosis not present

## 2019-08-15 LAB — CBC WITH DIFFERENTIAL/PLATELET
Abs Immature Granulocytes: 0.02 10*3/uL (ref 0.00–0.07)
Basophils Absolute: 0 10*3/uL (ref 0.0–0.1)
Basophils Relative: 0 %
Eosinophils Absolute: 0.2 10*3/uL (ref 0.0–0.5)
Eosinophils Relative: 2 %
HCT: 42.7 % (ref 36.0–46.0)
Hemoglobin: 13.5 g/dL (ref 12.0–15.0)
Immature Granulocytes: 0 %
Lymphocytes Relative: 31 %
Lymphs Abs: 2.3 10*3/uL (ref 0.7–4.0)
MCH: 28 pg (ref 26.0–34.0)
MCHC: 31.6 g/dL (ref 30.0–36.0)
MCV: 88.4 fL (ref 80.0–100.0)
Monocytes Absolute: 0.4 10*3/uL (ref 0.1–1.0)
Monocytes Relative: 6 %
Neutro Abs: 4.4 10*3/uL (ref 1.7–7.7)
Neutrophils Relative %: 61 %
Platelets: 186 10*3/uL (ref 150–400)
RBC: 4.83 MIL/uL (ref 3.87–5.11)
RDW: 14.3 % (ref 11.5–15.5)
WBC: 7.4 10*3/uL (ref 4.0–10.5)
nRBC: 0 % (ref 0.0–0.2)

## 2019-08-15 LAB — PROTIME-INR
INR: 0.9 (ref 0.8–1.2)
Prothrombin Time: 11.8 seconds (ref 11.4–15.2)

## 2019-08-15 MED ORDER — FENTANYL CITRATE (PF) 100 MCG/2ML IJ SOLN
INTRAMUSCULAR | Status: AC
  Filled 2019-08-15: qty 2

## 2019-08-15 MED ORDER — HYDROCODONE-ACETAMINOPHEN 5-325 MG PO TABS
1.0000 | ORAL_TABLET | ORAL | Status: DC | PRN
  Administered 2019-08-15: 1 via ORAL
  Filled 2019-08-15: qty 1

## 2019-08-15 MED ORDER — ONDANSETRON HCL 4 MG/2ML IJ SOLN
4.0000 mg | Freq: Once | INTRAMUSCULAR | Status: AC
  Administered 2019-08-15: 4 mg via INTRAVENOUS
  Filled 2019-08-15 (×2): qty 2

## 2019-08-15 MED ORDER — MIDAZOLAM HCL 2 MG/2ML IJ SOLN
INTRAMUSCULAR | Status: AC | PRN
  Administered 2019-08-15: 1 mg via INTRAVENOUS

## 2019-08-15 MED ORDER — LIDOCAINE HCL (PF) 1 % IJ SOLN
INTRAMUSCULAR | Status: AC
  Filled 2019-08-15: qty 30

## 2019-08-15 MED ORDER — FENTANYL CITRATE (PF) 100 MCG/2ML IJ SOLN
INTRAMUSCULAR | Status: AC | PRN
  Administered 2019-08-15: 50 ug via INTRAVENOUS

## 2019-08-15 MED ORDER — SODIUM CHLORIDE 0.9 % IV SOLN: INTRAVENOUS | Status: DC

## 2019-08-15 MED ORDER — GELATIN ABSORBABLE 12-7 MM EX MISC
CUTANEOUS | Status: AC
  Filled 2019-08-15: qty 1

## 2019-08-15 MED ORDER — MIDAZOLAM HCL 2 MG/2ML IJ SOLN
INTRAMUSCULAR | Status: AC
  Filled 2019-08-15: qty 2

## 2019-08-15 NOTE — Discharge Instructions (Addendum)
Liver Biopsy, Care After These instructions give you information on caring for yourself after your procedure. Your doctor may also give you more specific instructions. Call your doctor if you have any problems or questions after your procedure. What can I expect after the procedure? After the procedure, it is common to have:  Pain and soreness where the biopsy was done.  Bruising around the area where the biopsy was done.  Sleepiness and be tired for a few days. Follow these instructions at home: Medicines  Take over-the-counter and prescription medicines only as told by your doctor.  If you were prescribed an antibiotic medicine, take it as told by your doctor. Do not stop taking the antibiotic even if you start to feel better.  Do not take medicines such as aspirin and ibuprofen. These medicines can thin your blood. Do not take these medicines unless your doctor tells you to take them.  If you are taking prescription pain medicine, take actions to prevent or treat constipation. Your doctor may recommend that you: ? Drink enough fluid to keep your pee (urine) clear or pale yellow. ? Take over-the-counter or prescription medicines. ? Eat foods that are high in fiber, such as fresh fruits and vegetables, whole grains, and beans. ? Limit foods that are high in fat and processed sugars, such as fried and sweet foods. Caring for your cut  Follow instructions from your doctor about how to take care of your cuts from surgery (incisions). Make sure you: ? Wash your hands with soap and water before you change your bandage (dressing). If you cannot use soap and water, use hand sanitizer. ? Change your bandage as told by your doctor. ? Leave stitches (sutures), skin glue, or skin tape (adhesive) strips in place. They may need to stay in place for 2 weeks or longer. If tape strips get loose and curl up, you may trim the loose edges. Do not remove tape strips completely unless your doctor says it is  okay.  Check your cuts every day for signs of infection. Check for: ? Redness, swelling, or more pain. ? Fluid or blood. ? Pus or a bad smell. ? Warmth.  Do not take baths, swim, or use a hot tub until your doctor says it is okay to do so. Activity   Rest at home for 1-2 days or as told by your doctor. ? Avoid sitting for a long time without moving. Get up to take short walks every 1-2 hours.  Return to your normal activities as told by your doctor. Ask what activities are safe for you.  Do not do these things in the first 24 hours: ? Drive. ? Use machinery. ? Take a bath or shower.  Do not lift more than 10 pounds (4.5 kg) or play contact sports for the first 2 weeks. General instructions   Do not drink alcohol in the first week after the procedure.  Have someone stay with you for at least 24 hours after the procedure.  Get your test results. Ask your doctor or the department that is doing the test: ? When will my results be ready? ? How will I get my results? ? What are my treatment options? ? What other tests do I need? ? What are my next steps?  Keep all follow-up visits as told by your doctor. This is important. Contact a doctor if:  A cut bleeds and leaves more than just a small spot of blood.  A cut is red, puffs up (  swells), or hurts more than before.  Fluid or something else comes from a cut.  A cut smells bad.  You have a fever or chills. Get help right away if:  You have swelling, bloating, or pain in your belly (abdomen).  You get dizzy or faint.  You have a rash.  You feel sick to your stomach (nauseous) or throw up (vomit).  You have trouble breathing, feel short of breath, or feel faint.  Your chest hurts.  You have problems talking or seeing.  You have trouble with your balance or moving your arms or legs. Summary  After the procedure, it is common to have pain, soreness, bruising, and tiredness.  Your doctor will tell you how to  take care of yourself at home. Change your bandage, take your medicines, and limit your activities as told by your doctor.  Call your doctor if you have symptoms of infection. Get help right away if your belly swells, your cut bleeds a lot, or you have trouble talking or breathing. This information is not intended to replace advice given to you by your health care provider. Make sure you discuss any questions you have with your health care provider. Document Revised: 03/12/2017 Document Reviewed: 03/12/2017 Elsevier Patient Education  2020 Elsevier Inc. Moderate Conscious Sedation, Adult Sedation is the use of medicines to promote relaxation and relieve discomfort and anxiety. Moderate conscious sedation is a type of sedation. Under moderate conscious sedation, you are less alert than normal, but you are still able to respond to instructions, touch, or both. Moderate conscious sedation is used during short medical and dental procedures. It is milder than deep sedation, which is a type of sedation under which you cannot be easily woken up. It is also milder than general anesthesia, which is the use of medicines to make you unconscious. Moderate conscious sedation allows you to return to your regular activities sooner. Tell a health care provider about:  Any allergies you have.  All medicines you are taking, including vitamins, herbs, eye drops, creams, and over-the-counter medicines.  Use of steroids (by mouth or creams).  Any problems you or family members have had with sedatives and anesthetic medicines.  Any blood disorders you have.  Any surgeries you have had.  Any medical conditions you have, such as sleep apnea.  Whether you are pregnant or may be pregnant.  Any use of cigarettes, alcohol, marijuana, or street drugs. What are the risks? Generally, this is a safe procedure. However, problems may occur, including:  Getting too much medicine (oversedation).  Nausea.  Allergic  reaction to medicines.  Trouble breathing. If this happens, a breathing tube may be used to help with breathing. It will be removed when you are awake and breathing on your own.  Heart trouble.  Lung trouble. What happens before the procedure? Staying hydrated Follow instructions from your health care provider about hydration, which may include:  Up to 2 hours before the procedure - you may continue to drink clear liquids, such as water, clear fruit juice, black coffee, and plain tea. Eating and drinking restrictions Follow instructions from your health care provider about eating and drinking, which may include:  8 hours before the procedure - stop eating heavy meals or foods such as meat, fried foods, or fatty foods.  6 hours before the procedure - stop eating light meals or foods, such as toast or cereal.  6 hours before the procedure - stop drinking milk or drinks that contain milk.  2   hours before the procedure - stop drinking clear liquids. Medicine Ask your health care provider about:  Changing or stopping your regular medicines. This is especially important if you are taking diabetes medicines or blood thinners.  Taking medicines such as aspirin and ibuprofen. These medicines can thin your blood. Do not take these medicines before your procedure if your health care provider instructs you not to.  Tests and exams  You will have a physical exam.  You may have blood tests done to show: ? How well your kidneys and liver are working. ? How well your blood can clot. General instructions  Plan to have someone take you home from the hospital or clinic.  If you will be going home right after the procedure, plan to have someone with you for 24 hours. What happens during the procedure?  An IV tube will be inserted into one of your veins.  Medicine to help you relax (sedative) will be given through the IV tube.  The medical or dental procedure will be performed. What  happens after the procedure?  Your blood pressure, heart rate, breathing rate, and blood oxygen level will be monitored often until the medicines you were given have worn off.  Do not drive for 24 hours. This information is not intended to replace advice given to you by your health care provider. Make sure you discuss any questions you have with your health care provider. Document Revised: 02/12/2017 Document Reviewed: 06/22/2015 Elsevier Patient Education  2020 Elsevier Inc.  

## 2019-08-15 NOTE — Procedures (Signed)
Interventional Radiology Procedure Note  Procedure: US liver random core bx  Complications: None  Estimated Blood Loss: min  Findings: 18 g cores x 2

## 2019-08-15 NOTE — H&P (Signed)
Chief Complaint: Patient was seen in consultation today for liver core biopsy at the request of [place]  Referring Physician(s): Gupta,Rajesh  Supervising Physician: Daryll Brod  Patient Status: [facility] - Out-pt  History of Present Illness: Danielle Nelson is a 49 y.o. female   Wt gain during Covid--  Abnormal LFTs per PMD Likely fatty liver  Neg acute Hep panel Mother with Hx abnormal liver functions  [place] 06/15/19: IMPRESSION: 1. Negative for gallstones or biliary dilatation 2. Echogenic liver, suspect for steatosis. Probable focal fat sparing near the gallbladder fossa.  Request made per Dr Lyndel Safe for liver core biopsy   Past Medical History:  Diagnosis Date  . IBS (irritable bowel syndrome)   . Lyme disease     Past Surgical History:  Procedure Laterality Date  . BREAST BIOPSY Left   . FLEXIBLE SIGMOIDOSCOPY     was told she has ibs and gluten sensitivity. around 2008    Allergies: Patient has no known allergies.  Medications: Prior to Admission medications   Medication Sig Start Date End Date Taking? Authorizing Provider  ibuprofen (ADVIL) 200 MG tablet Take 400 mg by mouth every 8 (eight) hours as needed for moderate pain.    Yes [provider]  Multiple Vitamin (MULTI-VITAMIN) tablet Take 1 tablet by mouth daily.    Yes [provider]     Family History  Problem Relation Age of Onset  . High blood pressure Mother   . Parkinson's disease Father   . Stroke Maternal Grandmother   . Colon cancer Neg Hx   . Esophageal cancer Neg Hx     Social History   Socioeconomic History  . Marital status: Married    Spouse name: Not on file  . Number of children: 2  . Years of education: Not on file  . Highest education level: Not on file  Occupational History  . Occupation: archit  Tobacco Use  . Smoking status: Never Smoker  . Smokeless tobacco: Never Used  Substance and Sexual Activity  . Alcohol use: Not Currently  . Drug use:  Never  . Sexual activity: Yes    Partners: Male    Birth control/protection: None  Other Topics Concern  . Not on file  Social History Narrative  . Not on file   Social Determinants of Health   Financial Resource Strain:   . Difficulty of Paying Living Expenses:   Food Insecurity:   . Worried About Charity fundraiser in the Last Year:   . Arboriculturist in the Last Year:   Transportation Needs:   . Film/video editor (Medical):   Marland Kitchen Lack of Transportation (Non-Medical):   Physical Activity:   . Days of Exercise per Week:   . Minutes of Exercise per Session:   Stress:   . Feeling of Stress :   Social Connections:   . Frequency of Communication with Friends and Family:   . Frequency of Social Gatherings with Friends and Family:   . Attends Religious Services:   . Active Member of Clubs or Organizations:   . Attends Archivist Meetings:   Marland Kitchen Marital Status:     Review of Systems: A 12 point ROS discussed and pertinent positives are indicated in the HPI above.  All other systems are negative.  Review of Systems  Constitutional: Negative for activity change, fatigue and fever.  Respiratory: Negative for cough and shortness of breath.   Cardiovascular: Negative for chest pain.  Gastrointestinal: Negative for  abdominal pain.  Musculoskeletal: Negative for back pain.  Neurological: Negative for weakness.  Psychiatric/Behavioral: Negative for behavioral problems and confusion.    Vital Signs: BP 123/88   Pulse 85   Temp 98.6 F (37 C) (Oral)   Resp 16   Ht 5\' 4"  (1.626 m)   Wt 170 lb (77.1 kg)   LMP 07/22/2019   SpO2 99%   BMI 29.18 kg/m   Physical Exam Vitals reviewed.  Cardiovascular:     Rate and Rhythm: Normal rate and regular rhythm.     Heart sounds: Normal heart sounds.  Pulmonary:     Effort: Pulmonary effort is normal.     Breath sounds: Normal breath sounds.  Abdominal:     Palpations: Abdomen is soft.  Musculoskeletal:         General: Normal range of motion.  Skin:    General: Skin is warm and dry.  Neurological:     Mental Status: She is alert and oriented to person, place, and time.  Psychiatric:        Mood and Affect: Mood normal.        Behavior: Behavior normal.        Thought Content: Thought content normal.        Judgment: Judgment normal.     Imaging: No results found.  Labs:  CBC: Recent Labs    06/09/19 1429 08/15/19 0630  WBC 7.1 7.4  HGB 13.2 13.5  HCT 40.2 42.7  PLT 228 186    COAGS: Recent Labs    06/15/19 1003 08/15/19 0630  INR 1.0 0.9  APTT 23  --     BMP: Recent Labs    06/09/19 1429  NA 141  K 4.3  CL 103  CO2 29  GLUCOSE 72  BUN 10  CALCIUM 9.8  CREATININE 0.66  GFRNONAA 106  GFRAA 123    LIVER FUNCTION TESTS: Recent Labs    06/09/19 1429 06/15/19 1000  BILITOT 0.3 0.4  AST 44* 51*  ALT 109* 99*  PROT 7.0 6.9    TUMOR MARKERS: No results for input(s): AFPTM, CEA, CA199, CHROMGRNA in the last 8760 hours.  Assessment and Plan:  Abnormal liver function tests 08/15/19 shows echogenic liver-- suspects steatosis Scheduled now for liver core biopsy Risks and benefits of liver core biopsy was discussed with the patient and/or patient's family including, but not limited to bleeding, infection, damage to adjacent structures or low yield requiring additional tests.  All of the questions were answered and there is agreement to proceed. Consent signed and in chart.  Thank you for this interesting consult.  I greatly enjoyed meeting Danielle Nelson and look forward to participating in their care.  A copy of this report was sent to the requesting provider on this date.  Electronically Signed: [name], PA-C 08/15/2019, 7:23 AM   I spent a total of  30 Minutes   in face to face in clinical consultation, greater than 50% of which was counseling/coordinating care for liver core bx

## 2019-08-16 LAB — SURGICAL PATHOLOGY

## 2019-08-18 ENCOUNTER — Encounter: Payer: BC Managed Care – PPO | Admitting: Rehabilitative and Restorative Service Providers"

## 2019-08-22 ENCOUNTER — Other Ambulatory Visit: Payer: Self-pay

## 2019-08-22 ENCOUNTER — Encounter: Payer: Self-pay | Admitting: Physical Therapy

## 2019-08-22 ENCOUNTER — Ambulatory Visit: Payer: BC Managed Care – PPO | Attending: Obstetrics & Gynecology | Admitting: Physical Therapy

## 2019-08-22 DIAGNOSIS — N3941 Urge incontinence: Secondary | ICD-10-CM | POA: Insufficient documentation

## 2019-08-22 DIAGNOSIS — R278 Other lack of coordination: Secondary | ICD-10-CM | POA: Diagnosis not present

## 2019-08-22 DIAGNOSIS — M6281 Muscle weakness (generalized): Secondary | ICD-10-CM | POA: Diagnosis not present

## 2019-08-22 NOTE — Therapy (Signed)
[facility] [address], [place], [place], [address] Phone: [phone]   Fax:  [phone]  Physical Therapy Evaluation  Patient Details  Name: Danielle Nelson MRN: [id number] Date of Birth: [birth year] Referring Provider (PT): Dr. Silas Sacramento   Encounter Date: 08/22/2019  PT End of Session - 08/22/19 1742    Visit Number  10   9 for knee, 1 for pelvic floor   Date for PT Re-Evaluation  11/14/19    Authorization Type  [employer]    Authorization - Visit Number  10   add the knee and pelvic floor   Authorization - Number of Visits  30    PT Start Time  5188    PT Stop Time  1400    PT Time Calculation (min)  55 min    Activity Tolerance  Patient tolerated treatment well;No increased pain    Behavior During Therapy  WFL for tasks assessed/performed       Past Medical History:  Diagnosis Date  . IBS (irritable bowel syndrome)   . Lyme disease     Past Surgical History:  Procedure Laterality Date  . BREAST BIOPSY Left   . FLEXIBLE SIGMOIDOSCOPY     was told she has ibs and gluten sensitivity. around 2008    There were no vitals filed for this visit.   Subjective Assessment - 08/22/19 1316    Subjective  SEveral years ago her symptoms started. Started to go every 5-10 minutes. Patietn had a UTI. riding in the care and it shakes feels like she has to go. Had Dx of kidney stones and UTI. She saw Dr. Amalia Hailey and he tested her and not sure what is going on. Patient has the strong urge and needs to go at the moment or would leak urine. Soes alot of just in case urination. She goes every 2 hours. She sleeps through the night. Sometimes she will feel the urge to urinate after urinating. After she saw the MD, that night she had to urinate 2 times at night.    Pertinent History  multiple joint pain following Lyme's disease 2003 - continues to have constant joint pain    Patient Stated Goals  reduce the urge to void    Currently  in Pain?  No/denies         [facility] PT Assessment - 08/22/19 0001      Assessment   Medical Diagnosis  bladder pain and frequency    Referring Provider (PT)  Dr. Silas [place]    Onset Date/Surgical Date  --   2015   Prior Therapy  none for the therapy      Precautions   Precautions  None      Restrictions   Weight Bearing Restrictions  No      Balance Screen   Has the patient fallen in the past 6 months  No    Has the patient had a decrease in activity level because of a fear of falling?   No    Is the patient reluctant to leave their home because of a fear of falling?   No      Home Film/video editor residence      Prior Function   Level of Independence  Independent      Cognition   Overall Cognitive Status  Within Functional Limits for tasks assessed      Posture/Postural Control   Posture/Postural Control  No significant  limitations      ROM / Strength   AROM / PROM / Strength  AROM;PROM;Strength      AROM   Lumbar Flexion  decreased by 25%    Lumbar Extension  decreased by 25%    Lumbar - Right Side Bend  decreased by 25%    Lumbar - Left Side Bend  decreased by 25%    Lumbar - Right Rotation  decreased by 25%    Lumbar - Left Rotation  decreased by 25%      Strength   Right Hip Extension  4/5    Right Hip ABduction  4/5    Left Hip ABduction  4/5      Palpation   SI assessment   ASIS equal    Palpation comment  tenderness located in the right upper quadrant                  Objective measurements completed on examination: See above findings.    Pelvic Floor Special Questions - 08/22/19 0001    Prior Pregnancies  Yes    Number of Pregnancies  2    Number of Vaginal Deliveries  2    Any difficulty with labor and deliveries  No    Currently Sexually Active  Yes    Is this Painful  No    Urinary Leakage  Yes    Activities that cause leaking  Other;Laughing;Coughing;Sneezing   pad is damp   Urinary urgency  Yes     Urinary frequency  go every 2-3 hours, 1 time during the night    Fecal incontinence  No   constipation, strain   Skin Integrity  Intact    Pelvic Floor Internal Exam  Patient confirms identification and approves PT to assess the pelvic floor and treatment    Exam Type  Vaginal    Palpation  tenderness located on right ischiococcygeus; left side of bladder    Strength  weak squeeze, no lift                 PT Short Term Goals - 08/22/19 1755      PT SHORT TERM GOAL #1   Title  independent with initial HEP    Time  4    Period  Weeks    Status  New    Target Date  09/19/19        PT Long Term Goals - 08/22/19 1755      PT LONG TERM GOAL #6   Title  independent with advanced pelvic floor exericses    Time  12    Period  Weeks    Status  New    Target Date  11/14/19      PT LONG TERM GOAL #7   Title  able to delay the urge to void so she is able to wait 3-4 hours to urinate during the day    Time  12    Period  Weeks    Status  New    Target Date  11/14/19      PT LONG TERM GOAL #8   Title  urinate 0-1 times at night due to improved pelvic floor coordination and ability to delay the urge to urinate    Time  12    Period  Weeks    Status  New    Target Date  11/14/19      PT LONG TERM GOAL  #9   TITLE  urinary leakage decreased >/=  50% with laughing, coughing, and sneezing due to pelvic floor strength >/= 3/5 holding for 10 seconds    Time  12    Period  Weeks    Status  New    Target Date  11/14/19             Plan - 08/22/19 1745    Clinical Impression Statement  Patient is a 49 year old female with urge to void frequently and urinary leakage. Patient reports 5 years ago she had to hold her urine and she had increased pain. Since then she has to urinate frequently. Patient does not report bladder pain at this time. She has a history of pain from her Lymes disease and is having physicla therapy for her knee at [facility]. Patient  reports she gets up 2 times during the night to urinate. She gets the urge to urinate every 2 hours and has to go. She is not able to delay it. Patient will leak urine with cough, sneeze, and laugh. She reports constipation. Lumbar ROM is limited by 25%. She has tenderness located in the right upper quadrant. Hip abduction and extension 4/5. Pelvic floor strength is 2/5. She has tenderness located on the left side of the bladder and right ischiococcygeus. Patient would benefti from skilled therapy to work on pelvic floor coordination, reducing the urge to void and ability to fully empty her bladder and not strain with bowel movements.    Personal Factors and Comorbidities  Comorbidity 3+    Comorbidities  Lyme disease; IBS;grade 1 cystocele    Examination-Activity Limitations  Continence;Toileting;Sleep    Examination-Participation Restrictions  Community Activity    Stability/Clinical Decision Making  Evolving/Moderate complexity    Clinical Decision Making  Low    Rehab Potential  Good    PT Frequency  1x / week    PT Duration  12 weeks    PT Treatment/Interventions  Biofeedback;Cryotherapy;Moist Heat;Therapeutic activities;Therapeutic exercise;Manual techniques;Patient/family education;Neuromuscular re-education    PT Next Visit Plan  fascial release of the bladder and right upper quadrant, work on pelvic  floor internally on left side of bladder and right ischiococcygeus; pelvic floor strength, urge to void, bladder irritants    Consulted and Agree with Plan of Care  Patient       Patient will benefit from skilled therapeutic intervention in order to improve the following deficits and impairments:  Decreased endurance, Decreased strength, Decreased activity tolerance, Decreased coordination  Visit Diagnosis: Muscle weakness (generalized) - Plan: PT plan of care cert/re-cert  Other lack of coordination - Plan: PT plan of care cert/re-cert  Urge incontinence - Plan: PT plan of care  cert/re-cert     Problem List Patient Active Problem List   Diagnosis Date Noted  . Arthralgia 06/20/2019  . H/o Lyme disease 06/19/2019  . Onychomycosis due to dermatophyte 11/02/2016  . Urge incontinence 10/12/2013  . Bladder cystocele 09/29/2013  . Irritable bowel syndrome (IBS) 12/13/2012  . Perimenopausal symptoms 12/13/2012  . 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 Left cervical radiculopathy 03/18/2011  . Gluten intolerance 08/15/2010  . Allergic rhinitis 06/17/2010  . Rosacea 05/06/2007    Eulis Foster, PT 08/22/19 6:01 PM   [facility] [address] [place], [place], [address] Phone: [phone]   Fax:  [phone]  Name: Vaneta Hammontree MRN: [id number] Date of Birth: 1970-04-02

## 2019-08-23 ENCOUNTER — Ambulatory Visit (INDEPENDENT_AMBULATORY_CARE_PROVIDER_SITE_OTHER): Payer: BC Managed Care – PPO

## 2019-08-23 DIAGNOSIS — Z1231 Encounter for screening mammogram for malignant neoplasm of breast: Secondary | ICD-10-CM

## 2019-08-25 ENCOUNTER — Ambulatory Visit: Payer: BC Managed Care – PPO | Admitting: Family Medicine

## 2019-08-25 ENCOUNTER — Other Ambulatory Visit: Payer: Self-pay | Admitting: Obstetrics & Gynecology

## 2019-08-25 ENCOUNTER — Ambulatory Visit (INDEPENDENT_AMBULATORY_CARE_PROVIDER_SITE_OTHER): Payer: BC Managed Care – PPO

## 2019-08-25 ENCOUNTER — Encounter: Payer: Self-pay | Admitting: Family Medicine

## 2019-08-25 ENCOUNTER — Encounter: Payer: BC Managed Care – PPO | Admitting: Rehabilitative and Restorative Service Providers"

## 2019-08-25 ENCOUNTER — Other Ambulatory Visit: Payer: Self-pay

## 2019-08-25 DIAGNOSIS — R928 Other abnormal and inconclusive findings on diagnostic imaging of breast: Secondary | ICD-10-CM

## 2019-08-25 DIAGNOSIS — R1011 Right upper quadrant pain: Secondary | ICD-10-CM

## 2019-08-25 DIAGNOSIS — K760 Fatty (change of) liver, not elsewhere classified: Secondary | ICD-10-CM | POA: Diagnosis not present

## 2019-08-25 DIAGNOSIS — R160 Hepatomegaly, not elsewhere classified: Secondary | ICD-10-CM | POA: Diagnosis not present

## 2019-08-25 LAB — COMPLETE METABOLIC PANEL WITH GFR
AG Ratio: 1.8 (calc) (ref 1.0–2.5)
ALT: 70 U/L — ABNORMAL HIGH (ref 6–29)
AST: 27 U/L (ref 10–35)
Albumin: 4.5 g/dL (ref 3.6–5.1)
Alkaline phosphatase (APISO): 81 U/L (ref 31–125)
BUN: 12 mg/dL (ref 7–25)
CO2: 30 mmol/L (ref 20–32)
Calcium: 9.4 mg/dL (ref 8.6–10.2)
Chloride: 103 mmol/L (ref 98–110)
Creat: 0.74 mg/dL (ref 0.50–1.10)
GFR, Est African American: 113 mL/min/{1.73_m2} (ref >=60)
GFR, Est Non African American: 97 mL/min/{1.73_m2} (ref >=60)
Globulin: 2.5 g/dL (calc) (ref 1.9–3.7)
Glucose, Bld: 126 mg/dL (ref 65–139)
Potassium: 4.2 mmol/L (ref 3.5–5.3)
Sodium: 139 mmol/L (ref 135–146)
Total Bilirubin: 0.3 mg/dL (ref 0.2–1.2)
Total Protein: 7 g/dL (ref 6.1–8.1)

## 2019-08-25 LAB — CBC
HCT: 38.8 % (ref 35.0–45.0)
Hemoglobin: 12.8 g/dL (ref 11.7–15.5)
MCH: 28.1 pg (ref 27.0–33.0)
MCHC: 33 g/dL (ref 32.0–36.0)
MCV: 85.1 fL (ref 80.0–100.0)
MPV: 12 fL (ref 7.5–12.5)
Platelets: 210 10*3/uL (ref 140–400)
RBC: 4.56 10*6/uL (ref 3.80–5.10)
RDW: 13.6 % (ref 11.0–15.0)
WBC: 6.5 10*3/uL (ref 3.8–10.8)

## 2019-08-25 MED ORDER — IOHEXOL 300 MG/ML  SOLN
100.0000 mL | Freq: Once | INTRAMUSCULAR | Status: AC | PRN
Start: 2019-08-25 — End: 2019-08-25
  Administered 2019-08-25: 100 mL via INTRAVENOUS

## 2019-08-25 NOTE — Assessment & Plan Note (Signed)
Increasing pain since biopsy on 6/1 RUQ ttp and guarding.  Will obtain stat CT of abdomen and pelvis w/ contrast CMP and CBC ordered stat as well.

## 2019-08-25 NOTE — Progress Notes (Signed)
Danielle Nelson - 49 y.o. female MRN [id number]  Date of birth: 04-01-1970  Subjective Chief Complaint  Patient presents with  . Abdominal Pain    HPI Danielle Nelson is a 49 y.o. female here today with complaint of abdominal pain.  Pain is located in RUQ with radiation into the back.  She had liver biopsy completed on 08/15/19 for elevated LFT's.  States that she was told by radiology to call pcp if she had any problems.   She denies fever or chills.  Appetite has been normal without nausea.  Bowels are moving normally.  She has had some urinary frequency but denies pain or urgency.    ROS:  A comprehensive ROS was completed and negative except as noted per HPI  No Known Allergies  Past Medical History:  Diagnosis Date  . IBS (irritable bowel syndrome)   . Lyme disease     Past Surgical History:  Procedure Laterality Date  . BREAST BIOPSY Left   . FLEXIBLE SIGMOIDOSCOPY     was told she has ibs and gluten sensitivity. around 2008    Social History   Socioeconomic History  . Marital status: Married    Spouse name: Not on file  . Number of children: 2  . Years of education: Not on file  . Highest education level: Not on file  Occupational History  . Occupation: archit  Tobacco Use  . Smoking status: Never Smoker  . Smokeless tobacco: Never Used  Vaping Use  . Vaping Use: Never used  Substance and Sexual Activity  . Alcohol use: Not Currently  . Drug use: Never  . Sexual activity: Yes    Partners: Male    Birth control/protection: None  Other Topics Concern  . Not on file  Social History Narrative  . Not on file   Social Determinants of Health   Financial Resource Strain:   . Difficulty of Paying Living Expenses:   Food Insecurity:   . Worried About Programme researcher, broadcasting/film/video in the Last Year:   . Barista in the Last Year:   Transportation Needs:   . Freight forwarder (Medical):   Marland Kitchen Lack of Transportation (Non-Medical):   Physical Activity:   . Days of  Exercise per Week:   . Minutes of Exercise per Session:   Stress:   . Feeling of Stress :   Social Connections:   . Frequency of Communication with Friends and Family:   . Frequency of Social Gatherings with Friends and Family:   . Attends Religious Services:   . Active Member of Clubs or Organizations:   . Attends Banker Meetings:   Marland Kitchen Marital Status:     Family History  Problem Relation Age of Onset  . High blood pressure Mother   . Parkinson's disease Father   . Stroke Maternal Grandmother   . Colon cancer Neg Hx   . Esophageal cancer Neg Hx     Health Maintenance  Topic Date Due  . HIV Screening  Never done  . COVID-19 Vaccine (2 - Pfizer 2-dose series) 06/17/2019  . TETANUS/TDAP  05/31/2020 (Originally 08/28/1991)  . INFLUENZA VACCINE  10/15/2019  . PAP SMEAR-Modifier  08/07/2022  . Hepatitis C Screening  Completed     ----------------------------------------------------------------------------------------------------------------------------------------------------------------------------------------------------------------- Physical Exam BP 128/85 (BP Location: Left Arm, Patient Position: Sitting, Cuff Size: Small)   Pulse 81   Temp 98.5 F (36.9 C) (Oral)   Ht 5' 4.17" (1.63 m)   Wt 167 lb  6.4 oz (75.9 kg)   SpO2 97%   BMI 28.58 kg/m   Physical Exam Constitutional:      Appearance: She is well-developed.  HENT:     Head: Normocephalic and atraumatic.  Eyes:     General: No scleral icterus. Cardiovascular:     Rate and Rhythm: Normal rate and regular rhythm.  Pulmonary:     Effort: Pulmonary effort is normal.     Breath sounds: Normal breath sounds.  Abdominal:     General: There is no distension.     Palpations: Abdomen is soft.     Tenderness: There is abdominal tenderness (RUQ is tender to palpation and percussion). There is guarding. There is no right CVA tenderness or left CVA tenderness.  Musculoskeletal:     Cervical back: Neck  supple.  Neurological:     General: No focal deficit present.     Mental Status: She is alert.  Psychiatric:        Mood and Affect: Mood normal.        Behavior: Behavior normal.     ------------------------------------------------------------------------------------------------------------------------------------------------------------------------------------------------------------------- Assessment and Plan  RUQ pain Increasing pain since biopsy on 6/1 RUQ ttp and guarding.  Will obtain stat CT of abdomen and pelvis w/ contrast CMP and CBC ordered stat as well.   No orders of the defined types were placed in this encounter.   No follow-ups on file.    This visit occurred during the SARS-CoV-2 public health emergency.  Safety protocols were in place, including screening questions prior to the visit, additional usage of staff PPE, and extensive cleaning of exam room while observing appropriate contact time as indicated for disinfecting solutions.

## 2019-08-28 ENCOUNTER — Encounter: Payer: BC Managed Care – PPO | Admitting: Rehabilitative and Restorative Service Providers"

## 2019-08-29 ENCOUNTER — Other Ambulatory Visit: Payer: Self-pay

## 2019-08-29 ENCOUNTER — Encounter: Payer: Self-pay | Admitting: Physical Therapy

## 2019-08-29 ENCOUNTER — Encounter: Payer: BC Managed Care – PPO | Attending: Obstetrics & Gynecology | Admitting: Physical Therapy

## 2019-08-29 DIAGNOSIS — R278 Other lack of coordination: Secondary | ICD-10-CM | POA: Insufficient documentation

## 2019-08-29 DIAGNOSIS — M6281 Muscle weakness (generalized): Secondary | ICD-10-CM | POA: Diagnosis not present

## 2019-08-29 DIAGNOSIS — M25562 Pain in left knee: Secondary | ICD-10-CM | POA: Diagnosis not present

## 2019-08-29 DIAGNOSIS — G8929 Other chronic pain: Secondary | ICD-10-CM | POA: Diagnosis not present

## 2019-08-29 DIAGNOSIS — N3941 Urge incontinence: Secondary | ICD-10-CM | POA: Insufficient documentation

## 2019-08-29 NOTE — Therapy (Addendum)
[place] at [facility] [address], [place], [place], [address] Phone: [phone]   Fax:  [phone]  Physical Therapy Treatment  Patient Details  Name: Danielle Nelson MRN: [id number] Date of Birth: [birth year] Referring Provider (PT): Dr. Silas Sacramento   Encounter Date: 08/29/2019   PT End of Session - 08/29/19 1610    Visit Number 11   2 pelvic floor, 9 knee   Date for PT Re-Evaluation 11/14/19    Authorization Type BCBS    Authorization - Visit Number 11    Authorization - Number of Visits 30    PT Start Time 1660    PT Stop Time 6301    PT Time Calculation (min) 42 min    Activity Tolerance Patient tolerated treatment well;No increased pain    Behavior During Therapy WFL for tasks assessed/performed           Past Medical History:  Diagnosis Date  . IBS (irritable bowel syndrome)   . Lyme disease     Past Surgical History:  Procedure Laterality Date  . BREAST BIOPSY Left   . FLEXIBLE SIGMOIDOSCOPY     was told she has ibs and gluten sensitivity. around 2008    There were no vitals filed for this visit.   Subjective Assessment - 08/29/19 1607    Subjective I did not have a trigger to go to the bathroom. I still go to  the bathroom often.    Pertinent History multiple joint pain following Lyme's disease 2003 - continues to have constant joint pain    Patient Stated Goals reduce the urge to void                             [facility] Adult PT Treatment/Exercise - 08/29/19 0001      Neuro Re-ed    Neuro Re-ed Details  diaphragmatic breathing with expanding air into the pelvic floor and opening up the vagina; urge to void to reduce the urge with breathing technique and 5 quick flicks      Manual Therapy   Manual Therapy Myofascial release    Manual therapy comments educated patient on how to massage around the bladder genlty to reduce fascial tension    Myofascial Release release the urogenital  diaphragm and along the bladder                  PT Education - 08/29/19 1655    Education Details diaphragmatic breathing; urge to void; lower abdominal massage    Person(s) Educated Patient    Methods Explanation;Demonstration;Handout    Comprehension Verbalized understanding;Returned demonstration            PT Short Term Goals - 08/29/19 1700      PT SHORT TERM GOAL #1   Title independent with initial HEP    Time 4    Period Weeks    Status On-going             PT Long Term Goals - 08/29/19 1701      PT LONG TERM GOAL #4   Title Independent in HEP      PT LONG TERM GOAL #5   Time 6      PT LONG TERM GOAL #6   Title independent with advanced pelvic floor exericses    Time 12    Period Weeks    Status On-going      PT LONG TERM GOAL #7  Title able to delay the urge to void so she is able to wait 3-4 hours to urinate during the day    Time 12    Period Weeks    Status On-going      PT LONG TERM GOAL #8   Title urinate 0-1 times at night due to improved pelvic floor coordination and ability to delay the urge to urinate    Time 12    Period Weeks    Status On-going      PT LONG TERM GOAL  #9   TITLE urinary leakage decreased >/= 50% with laughing, coughing, and sneezing due to pelvic floor strength >/= 3/5 holding for 10 seconds    Time 12    Period Weeks    Status On-going                 Plan - 08/29/19 1611    Clinical Impression Statement Patient did not have the cramping after the last visit. Patient continues to have the urge to void and goes frequently. Patient has learned the urge to void exercise to delay the urge. Patient was able to bulge the pelvic flor with diaphragmatic breathing. Patient will be seen in August due to therapist availablility. Patient will benefit from skilled therapy to work on pelvic floor coordination, reducing the urge to void and abdility to fully empty her bladder and not strain with bowel movement.     Personal Factors and Comorbidities Comorbidity 3+    Comorbidities Lyme disease; IBS;grade 1 cystocele    Examination-Activity Limitations Continence;Toileting;Sleep    Examination-Participation Restrictions Community Activity    Stability/Clinical Decision Making Evolving/Moderate complexity    Rehab Potential Good    PT Frequency 1x / week    PT Duration 12 weeks    PT Treatment/Interventions Biofeedback;Cryotherapy;Moist Heat;Therapeutic activities;Therapeutic exercise;Manual techniques;Patient/family education;Neuromuscular re-education    PT Next Visit Plan education on fully emptying her bladder and stool, see how the urge to void is, education on cystocele and how to manage it, internal soft tissue work and assess pelvic floor contraction    PT Home Exercise Plan Access Code: [email or web address] and Access Code: [email or web address]    Consulted and Agree with Plan of Care Patient           Patient will benefit from skilled therapeutic intervention in order to improve the following deficits and impairments:  Decreased endurance, Decreased strength, Decreased activity tolerance, Decreased coordination  Visit Diagnosis: Muscle weakness (generalized)  Other lack of coordination  Urge incontinence  Chronic pain of left knee     Problem List Patient Active Problem List   Diagnosis Date Noted  . RUQ pain 08/25/2019  . Arthralgia 06/20/2019  . H/o Lyme disease 06/19/2019  . Onychomycosis due to dermatophyte 11/02/2016  . Urge incontinence 10/12/2013  . Bladder cystocele 09/29/2013  . Irritable bowel syndrome (IBS) 12/13/2012  . Perimenopausal symptoms 12/13/2012  . 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 Left cervical radiculopathy 03/18/2011  . Gluten intolerance 08/15/2010  . Allergic rhinitis 06/17/2010  . Rosacea 05/06/2007    Earlie Counts, PT 08/29/19 5:03 PM    Outpatient Rehabilitation at [facility] [address], [place], [place], [address] Phone:  [phone]   Fax:  [phone]  Name: Amaira Safley MRN: [id number] Date of Birth: 03-05-1971  PHYSICAL THERAPY DISCHARGE SUMMARY  Visits from Start of Care: 2 for the pelvic floor  Current functional level related to goals / functional outcomes: See above. Patient called and canceled her appointment on  11/07/19 with no reason. Patient has not called to reschedule any further visits.    Remaining deficits: See above    Education / Equipment: HEP Plan:                                                    Patient goals were not met. Patient is being discharged due to not returning since the last visit.  Thank you for the referral. Earlie Counts, PT 11/22/19 9:10 AM  ?????

## 2019-08-29 NOTE — Patient Instructions (Addendum)
Urge Incontinence  . Ideal urination frequency is every 2-4 wakeful hours, which equates to 5-8 times within a 24-hour period.   . Urge incontinence is leakage that occurs when the bladder muscle contracts, creating a sudden need to go before getting to the bathroom.   . Going too often when your bladder isn't actually full can disrupt the body's automatic signals to store and hold urine longer, which will increase urgency/frequency.  In this case, the bladder "is running the show" and strategies can be learned to retrain this pattern.   . One should be able to control the first urge to urinate, at around .  The bladder can hold up to a "grande latte," or . . To help you gain control, practice the Urge Drill below when urgency strikes.  This drill will help retrain your bladder signals and allow you to store and hold urine longer.  The overall goal is to stretch out your time between voids to reach a more manageable voiding schedule.    . Practice your "quick flicks" often throughout the day (each waking hour) even when you don't need feel the urge to go.  This will help strengthen your pelvic floor muscles, making them more effective in controlling leakage.  Urge Drill  When you feel an urge to go, follow these steps to regain control: 1. Stop what you are doing and be still 2. Take one deep breath, directing your air into your abdomen 3. Think an affirming thought, such as "I've got this." 4. Do 5 quick flicks of your pelvic floor 5. Walk with control to the bathroom to void, or delay voiding   Every day massage the lower abdomen to relax the muscles  Danielle Nelson, PT [facility] [address] [place], [place] [address] W: [phone] [email or web address]

## 2019-09-01 ENCOUNTER — Other Ambulatory Visit: Payer: Self-pay | Admitting: Osteopathic Medicine

## 2019-09-01 DIAGNOSIS — R928 Other abnormal and inconclusive findings on diagnostic imaging of breast: Secondary | ICD-10-CM

## 2019-09-04 ENCOUNTER — Other Ambulatory Visit: Payer: Self-pay

## 2019-09-04 ENCOUNTER — Ambulatory Visit
Admission: RE | Admit: 2019-09-04 | Discharge: 2019-09-04 | Disposition: A | Discharge status: Home / Self Care | Payer: BC Managed Care – PPO | Source: Ambulatory Visit | Attending: Obstetrics & Gynecology | Admitting: Obstetrics & Gynecology

## 2019-09-04 DIAGNOSIS — N6002 Solitary cyst of left breast: Secondary | ICD-10-CM | POA: Diagnosis not present

## 2019-09-04 DIAGNOSIS — R922 Inconclusive mammogram: Secondary | ICD-10-CM | POA: Diagnosis not present

## 2019-09-04 DIAGNOSIS — R928 Other abnormal and inconclusive findings on diagnostic imaging of breast: Secondary | ICD-10-CM

## 2019-09-08 ENCOUNTER — Other Ambulatory Visit: Payer: Self-pay

## 2019-09-08 ENCOUNTER — Ambulatory Visit (INDEPENDENT_AMBULATORY_CARE_PROVIDER_SITE_OTHER): Payer: BC Managed Care – PPO | Admitting: Gastroenterology

## 2019-09-08 DIAGNOSIS — Z23 Encounter for immunization: Secondary | ICD-10-CM

## 2019-09-10 DIAGNOSIS — Z23 Encounter for immunization: Secondary | ICD-10-CM

## 2019-09-11 ENCOUNTER — Telehealth: Payer: Self-pay | Admitting: Osteopathic Medicine

## 2019-09-11 ENCOUNTER — Encounter: Payer: BC Managed Care – PPO | Admitting: Gastroenterology

## 2019-09-11 NOTE — Progress Notes (Signed)
Have discussed in detail the liver biopsy results It does show grade 1 steatohepatitis, stage I fibrosis No evidence of PBC Patient is going out of [place] until mid August Plan: -Follow-up in September 2021 -At follow-up we will repeat blood tests and consider colonoscopy RG  Malachi Bonds, please make appointment for her in September RG

## 2019-09-11 NOTE — Telephone Encounter (Signed)
Pt husband came in wanting to prove she had her second covid vaccine. He did show documentation and her second dose was on 06/17/19 Proofreader). They want it keyed in since they are going to have it printed for "personal reasons".

## 2019-09-11 NOTE — Telephone Encounter (Signed)
Task completed. Pt's immunization record has been updated.

## 2019-09-12 DIAGNOSIS — Z20828 Contact with and (suspected) exposure to other viral communicable diseases: Secondary | ICD-10-CM | POA: Diagnosis not present

## 2019-09-13 ENCOUNTER — Telehealth: Payer: Self-pay

## 2019-09-13 NOTE — Telephone Encounter (Signed)
Spoke with patient regarding follow up appointment.  Patient states she is going out of the country and wants to cancel colonoscopy at this time.  She states she will reschedule when she returns.  She will call to make a follow up appointment with Dr Chales Abrahams in October (per after conversation with Dr. Chales Abrahams make appt for Oct instead of Sept)   The schedule is not available for October so patient will call when schedule is open.

## 2019-10-02 ENCOUNTER — Encounter: Payer: BC Managed Care – PPO | Admitting: Gastroenterology

## 2019-10-17 ENCOUNTER — Encounter: Payer: Self-pay | Admitting: Physical Therapy

## 2019-11-07 ENCOUNTER — Encounter: Payer: Self-pay | Admitting: Physical Therapy

## 2020-10-29 ENCOUNTER — Telehealth: Payer: BC Managed Care – PPO | Admitting: Physician Assistant

## 2020-10-29 DIAGNOSIS — U071 COVID-19: Secondary | ICD-10-CM

## 2020-10-29 MED ORDER — MOLNUPIRAVIR EUA 200MG CAPSULE: 4.0000 | ORAL_CAPSULE | Freq: Two times a day (BID) | ORAL | 0 refills | Status: AC

## 2020-10-29 MED ORDER — TRIAMCINOLONE ACETONIDE 55 MCG/ACT NA AERO: 2.0000 | INHALATION_SPRAY | Freq: Every day | NASAL | 0 refills | Status: DC

## 2020-10-29 NOTE — Progress Notes (Signed)
Virtual Visit Consent   Danielle Nelson, you are scheduled for a virtual visit with a [facility] provider today.     Just as with appointments in the office, your consent must be obtained to participate.  Your consent will be active for this visit and any virtual visit you may have with one of our providers in the next 365 days.     If you have a MyChart account, a copy of this consent can be sent to you electronically.  All virtual visits are billed to your insurance company just like a traditional visit in the office.    As this is a virtual visit, video technology does not allow for your provider to perform a traditional examination.  This may limit your provider's ability to fully assess your condition.  If your provider identifies any concerns that need to be evaluated in person or the need to arrange testing (such as labs, EKG, etc.), we will make arrangements to do so.     Although advances in technology are sophisticated, we cannot ensure that it will always work on either your end or our end.  If the connection with a video visit is poor, the visit may have to be switched to a telephone visit.  With either a video or telephone visit, we are not always able to ensure that we have a secure connection.     I need to obtain your verbal consent now.   Are you willing to proceed with your visit today?    Danielle Nelson has provided verbal consent on 10/29/2020 for a virtual visit (video or telephone).   Danielle Nelson, [place]   Date: 10/29/2020 12:24 PM   Virtual Visit via Video Note   I, Danielle Nelson, connected with  Danielle Nelson  ([id number], May 10, 1970) on 10/29/20 at 11:45 AM EDT by a video-enabled telemedicine application and verified that I am speaking with the correct person using two identifiers.  Location: Patient: Virtual Visit Location Patient: Home Provider: Virtual Visit Location Provider: Home Office   I discussed the limitations of evaluation and management by  telemedicine and the availability of in person appointments. The patient expressed understanding and agreed to proceed.    History of Present Illness: Danielle Nelson is a 50 y.o. who identifies as a female who was assigned female at birth, and is being seen today for COVID-19. Endorses symptoms starting 2 days ago with sore throat. Initially took a COVID test on Saturday which was negative. Notes by later in the day developing a fever (Tmax 102.5), reducible with fever but worse at night time. Endorses nasal congestion, sinus pressure, chest congestion and cough. Has noted some shortness of breath, worse in the morning on waking but has improved throughout the day. Denies any overt chest pain. Repeat COVID test is positive.   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 , Disp: , Rfl:   Observations/Objective: Patient is well-developed, well-nourished in no acute distress.  Resting comfortably at home.  Head is normocephalic, atraumatic.  No labored breathing. Speech is clear and coherent with logical content.  Patient  is alert and oriented at baseline.   Assessment and Plan: 1. COVID-19 - triamcinolone (NASACORT) 55 MCG/ACT AERO nasal inhaler; Place 2 sprays into the nose daily.  Dispense: 1 each; Refill: 0 - molnupiravir EUA 200 mg CAPS; Take 4 capsules (800 mg total) by mouth 2 (two) times daily for 5 days.  Dispense: 40 capsule; Refill: 0 - MyChart COVID-19 home monitoring program; Future Discussed risks/benefits of antiviral medications including most common potential ADRs. Patient voiced understanding and would like to proceed with antiviral medication. They are candidate for molnupiravir giving severity of symptoms. Rx sent to pharmacy. Supportive measures, OTC medications and vitamin regimen reviewed. Nasacort per orders. Patient has been enrolled in a MyChart COVID symptom monitoring program. Anne Shutter reviewed in detail. Strict ER precautions discussed with patient.    Follow Up Instructions: I discussed the assessment and treatment plan with the patient. The patient was provided an opportunity to ask questions and all were answered. The patient agreed with the plan and demonstrated an understanding of the instructions.  A copy of instructions were sent to the patient via MyChart.  The patient was advised to call back or seek an in-person evaluation if the symptoms worsen or if the condition fails to improve as anticipated.  Time:  I spent 15 minutes with the patient via telehealth technology discussing the above problems/concerns.    Danielle Climes, PA-C

## 2020-10-29 NOTE — Patient Instructions (Signed)
[name], thank you for joining Piedad Climes, PA-C for today's virtual visit.  While this provider is not your primary care provider (PCP), if your PCP is located in our provider database this encounter information will be shared with them immediately following your visit.  Consent: (Patient) [name] provided verbal consent for this virtual visit at the beginning of the encounter.  Current Medications:  Current Outpatient Medications:    ibuprofen (ADVIL) 200 MG tablet, Take 400 mg by mouth every 8 (eight) hours as needed for moderate pain. , Disp: , Rfl:    Multiple Vitamin (MULTI-VITAMIN) tablet, Take 1 tablet by mouth daily. , Disp: , Rfl:    Medications ordered in this encounter:  No orders of the defined types were placed in this encounter.    *If you need refills on other medications prior to your next appointment, please contact your pharmacy*  Follow-Up: Call back or seek an in-person evaluation if the symptoms worsen or if the condition fails to improve as anticipated.  Other Instructions Please keep well-hydrated and get plenty of rest. Start a saline nasal rinse to flush out your nasal passages. You can use plain Mucinex to help thin congestion. If you have a humidifier, running in the bedroom at night. I want you to start OTC vitamin D3 1000 units daily, vitamin C 1000 mg daily, and a zinc supplement. Please take prescribed medications as directed -- antiviral (molnupiravir) and nasal steroid (Nasacort).  Remember you can alternate tylenol and ibuprofen for headache, sore throat and fever  You have been enrolled in a MyChart symptom monitoring program. Please answer these questions daily so we can keep track of how you are doing.  You were to quarantine for 5 days from onset of your symptoms.  After day 5, if you have had no fever and you are feeling better, you can end quarantine but need to mask for an additional 5 days. After day 5 if you have a fever  or are having significant symptoms, please quarantine for full 10 days.  If you note any worsening of symptoms, any significant shortness of breath or any chest pain, please seek ER evaluation ASAP.  Please do not delay care!  COVID-19: What to Do if You Are Sick CDC has updated isolation and quarantine recommendations for the public, and is revising the CDC website to reflect these changes. These recommendations do not apply to healthcare personnel and do not supersede state, local, tribal, or territorial laws, rules, andregulations. If you have a fever, cough or other symptoms, you might have COVID-19. Most people have mild illness and are able to recover at home. If you are sick: Keep track of your symptoms. If you have an emergency warning sign (including trouble breathing), call 911. Steps to help prevent the spread of COVID-19 if you are sick If you are sick with COVID-19 or think you might have COVID-19, follow the steps below to care for yourself and to help protect other peoplein your home and community. Stay home except to get medical care Stay home. Most people with COVID-19 have mild illness and can recover at home without medical care. Do not leave your home, except to get medical care. Do not visit public areas. Take care of yourself. Get rest and stay hydrated. Take over-the-counter medicines, such as acetaminophen, to help you feel better. Stay in touch with your doctor. Call before you get medical care. Be sure to get care if you have trouble breathing, or have any  other emergency warning signs, or if you think it is an emergency. Avoid public transportation, ride-sharing, or taxis. Separate yourself from other people As much as possible, stay in a specific room and away from other people and pets in your home. If possible, you should use a separate bathroom. If you need to be around other people or animals in oroutside of the home, wear a mask. Tell your close contactsthat they  may have been exposed to COVID-19. An infected person can spread COVID-19 starting 48 hours (or 2 days) before the person has any symptoms or tests positive. By letting your close contacts know they may have been exposed to COVID-19, you are helping to protect everyone. Additional guidance is available for those living in close quarters and shared housing. See COVID-19 and Animals if you have questions about pets. If you are diagnosed with COVID-19, someone from the health department may call you. Answer the call to slow the spread. Monitor your symptoms Symptoms of COVID-19 include fever, cough, or other symptoms. Follow care instructions from your healthcare provider and local health department. Your local health authorities may give instructions on checking your symptoms and reporting information. When to seek emergency medical attention Look for emergency warning signs* for COVID-19. If someone is showing any of these signs, seek emergency medical care immediately: Trouble breathing Persistent pain or pressure in the chest New confusion Inability to wake or stay awake Pale, gray, or blue-colored skin, lips, or nail beds, depending on skin tone *This list is not all possible symptoms. Please call your medical provider forany other symptoms that are severe or concerning to you. Call 911 or call ahead to your local emergency facility: Notify the operator that you are seeking care for someone who has or may haveCOVID-19. Call ahead before visiting your doctor Call ahead. Many medical visits for routine care are being postponed or done by phone or telemedicine. If you have a medical appointment that cannot be postponed, call your doctor's office, and tell them you have or may have COVID-19. This will help the office protect themselves and other patients. Get tested If you have symptoms of COVID-19, get tested. While waiting for test results, you stay away from others, including staying apart from  those living in your household. Self-tests are one of several options for testing for the virus that causes COVID-19 and may be more convenient than laboratory-based tests and point-of-care tests. Ask your healthcare provider or your local health department if you need help interpreting your test results. You can visit your state, tribal, local, and territorial health department's website to look for the latest local information on testing sites. If you are sick, wear a mask over your nose and mouth You should wear a mask over your nose and mouth if you must be around other people or animals, including pets (even at home). You don't need to wear the mask if you are alone. If you can't put on a mask (because of trouble breathing, for example), cover your coughs and sneezes in some other way. Try to stay at least 6 feet away from other people. This will help protect the people around you. Masks should not be placed on young children under age 43 years, anyone who has trouble breathing, or anyone who is not able to remove the mask without help. Note: During the COVID-19 pandemic, medical grade facemasks are reserved forhealthcare workers and some first responders. Cover your coughs and sneezes Cover your mouth and nose with a tissue  when you cough or sneeze. Throw away used tissues in a lined trash can. Immediately wash your hands with soap and water for at least 20 seconds. If soap and water are not available, clean your hands with an alcohol-based hand sanitizer that contains at least 60% alcohol. Clean your hands often Wash your hands often with soap and water for at least 20 seconds. This is especially important after blowing your nose, coughing, or sneezing; going to the bathroom; and before eating or preparing food. Use hand sanitizer if soap and water are not available. Use an alcohol-based hand sanitizer with at least 60% alcohol, covering all surfaces of your hands and rubbing them together until  they feel dry. Soap and water are the best option, especially if hands are visibly dirty. Avoid touching your eyes, nose, and mouth with unwashed hands. Handwashing Tips Avoid sharing personal household items Do not share dishes, drinking glasses, cups, eating utensils, towels, or bedding with other people in your home. Wash these items thoroughly after using them with soap and water or put in the dishwasher. Clean all "high-touch" surfaces every day Clean and disinfect high-touch surfaces in your "sick room" and bathroom; wear disposable gloves. Let someone else clean and disinfect surfaces in common areas, but you should clean your bedroom and bathroom, if possible. If a caregiver or other person needs to clean and disinfect a sick person's bedroom or bathroom, they should do so on an as-needed basis. The caregiver/other person should wear a mask and disposable gloves prior to cleaning. They should wait as long as possible after the person who is sick has used the bathroom before coming in to clean and use the bathroom. High-touch surfaces include phones, remote controls, counters, tabletops, doorknobs, bathroom fixtures, toilets, keyboards, tablets, and bedside tables. Clean and disinfect areas that may have blood, stool, or body fluids on them. Use household cleaners and disinfectants. Clean the area or item with soap and water or another detergent if it is dirty. Then, use a household disinfectant. Be sure to follow the instructions on the label to ensure safe and effective use of the product. Many products recommend keeping the surface wet for several minutes to ensure germs are killed. Many also recommend precautions such as wearing gloves and making sure you have good ventilation during use of the product. Use a product from [employer] List N: Disinfectants for Coronavirus (COVID-19). Complete Disinfection Guidance When you can be around others after being sick with COVID-19 Deciding when you can  be around others is different for different situations. Find out when you can safely end home isolation. For any additional questions about your care,contact your healthcare provider or state or local health department. 02/21/2020 Content source: [facility] ([facility]), Division of Viral Diseases This information is not intended to replace advice given to you by your health care provider. Make sure you discuss any questions you have with your healthcare provider. Document Revised: 04/19/2020 Document Reviewed: 04/19/2020 Elsevier Patient Education  2022 [employer].    If you have been instructed to have an in-person evaluation today at a local Urgent Care facility, please use the link below. It will take you to a list of all of our available [facility], including address, phone number and hours of operation. Please do not delay care.  [facility]  If you or a family member do not have a primary care provider, use the link below to schedule a visit and  establish care. When you choose a [facility] primary care physician or advanced practice provider, you gain a long-term partner in health. Find a Primary Care Provider  Learn more about [facility] in-office and virtual care options: [facility] Get Care Now

## 2020-11-05 ENCOUNTER — Encounter (INDEPENDENT_AMBULATORY_CARE_PROVIDER_SITE_OTHER): Payer: Self-pay

## 2020-11-14 ENCOUNTER — Ambulatory Visit: Payer: BC Managed Care – PPO

## 2020-11-16 ENCOUNTER — Ambulatory Visit: Payer: BC Managed Care – PPO

## 2021-04-09 IMAGING — CT CT ABD-PELV W/ CM
2 of 5 series · 16 of 46 positions shown, 18 images · IV contrast (APPLIED)
Comparison: None.

CLINICAL DATA: Right upper quadrant pain for 3 days. The patient
underwent a liver biopsy 08/15/2019.

EXAM:
CT ABDOMEN AND PELVIS WITH CONTRAST
TECHNIQUE: Multidetector CT imaging of the abdomen and pelvis was performed
using the standard protocol following bolus administration of
intravenous contrast.
CONTRAST:  100 mL OMNIPAQUE IOHEXOL 300 MG/ML  SOLN

[Series 2: axial st · axial · 0.91mm/px · z∈[-495,-35]mm · 13 of 104 slices shown, 15 images]
[im 6/104  soft-tissue]
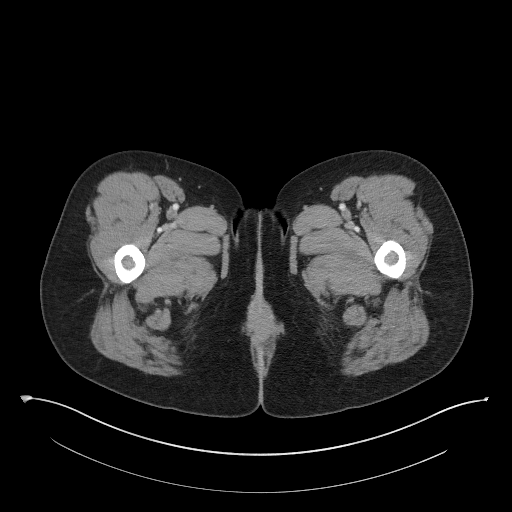
[im 6/104  bone]
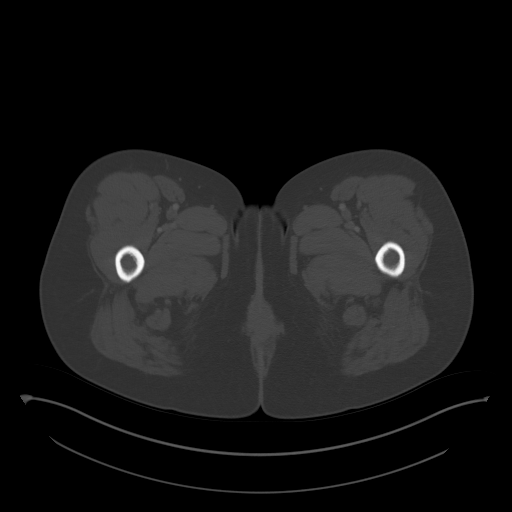
[im 16/104  soft-tissue]
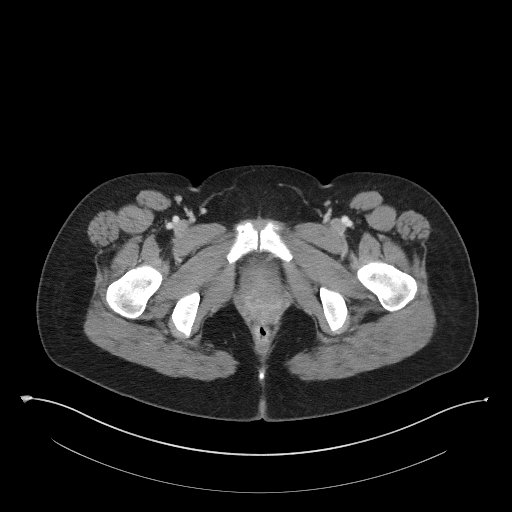
[im 21/104  soft-tissue]
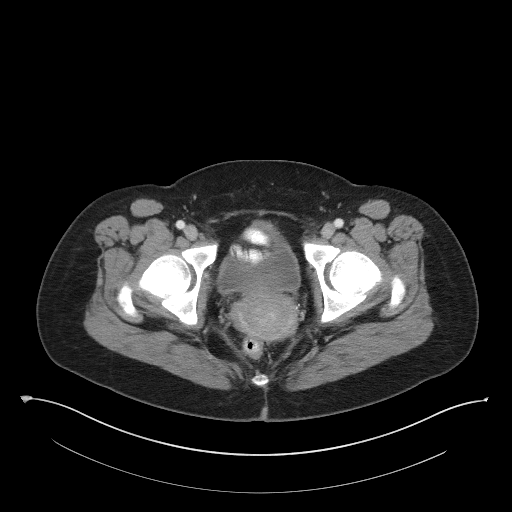
[im 31/104  soft-tissue]
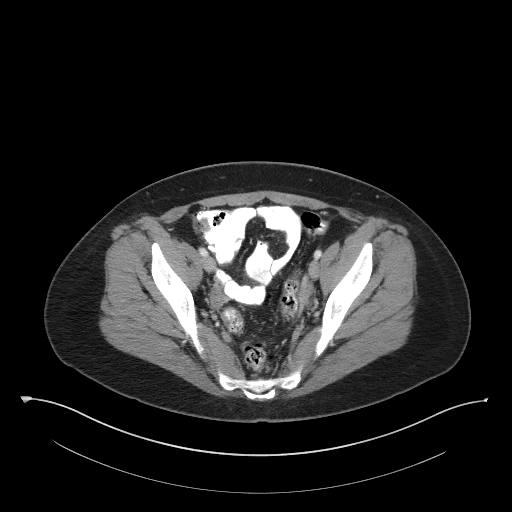
[im 37/104  soft-tissue]
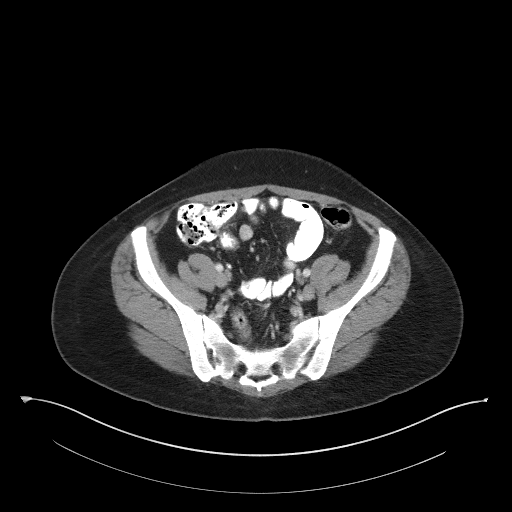
[im 47/104  soft-tissue]
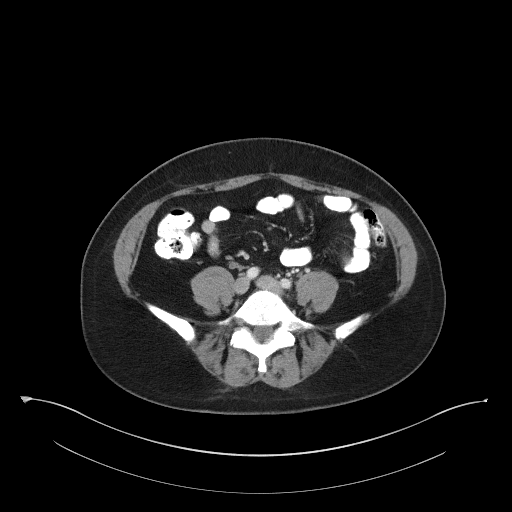
[im 52/104  soft-tissue]
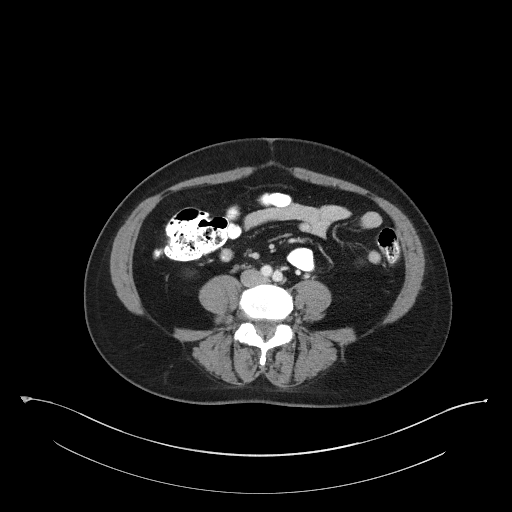
[im 57/104  soft-tissue]
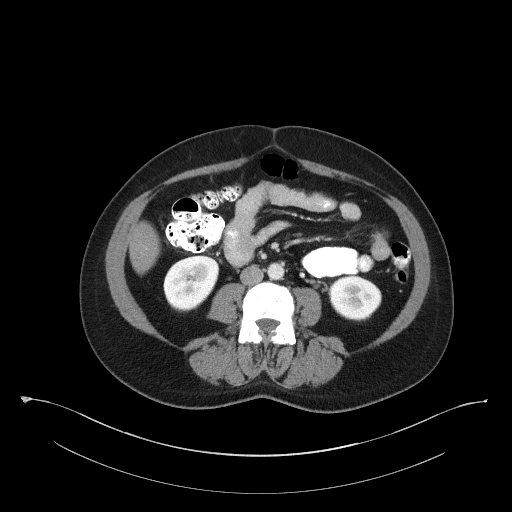
[im 67/104  soft-tissue]
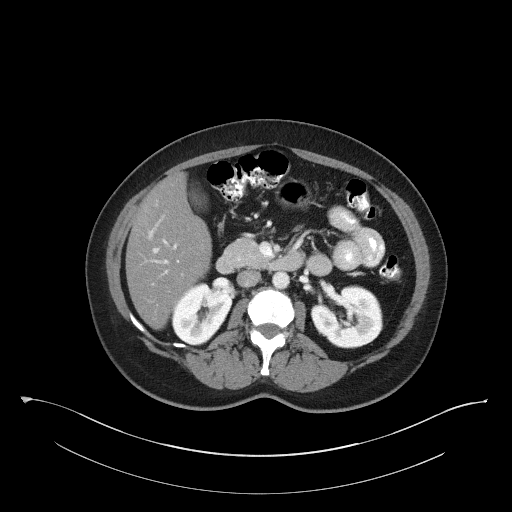
[im 67/104  bone]
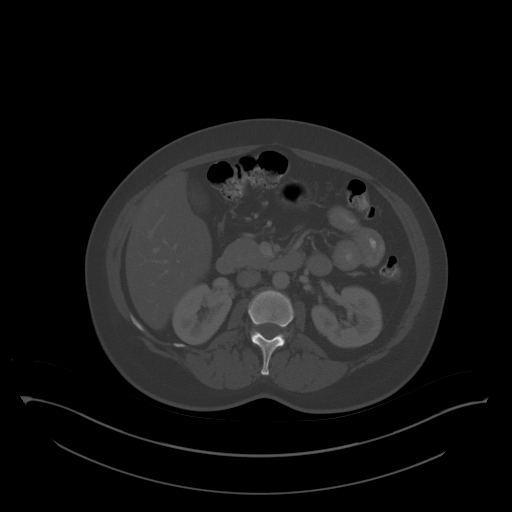
[im 73/104  soft-tissue]
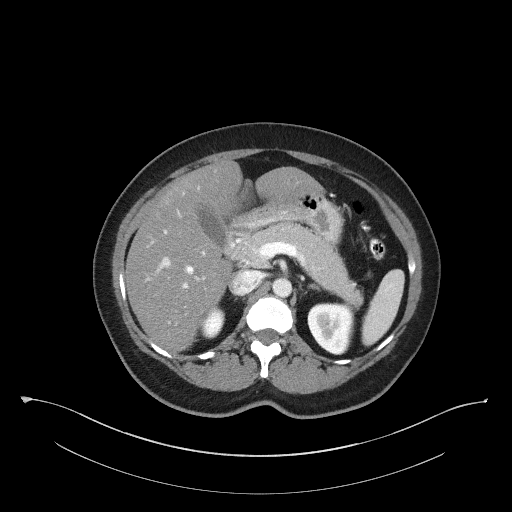
[im 83/104  soft-tissue]
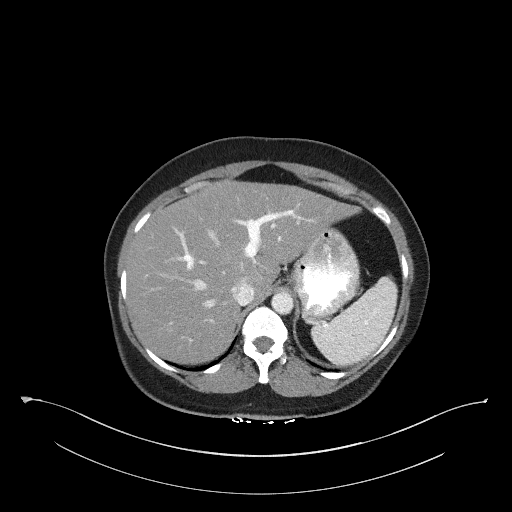
[im 88/104  soft-tissue]
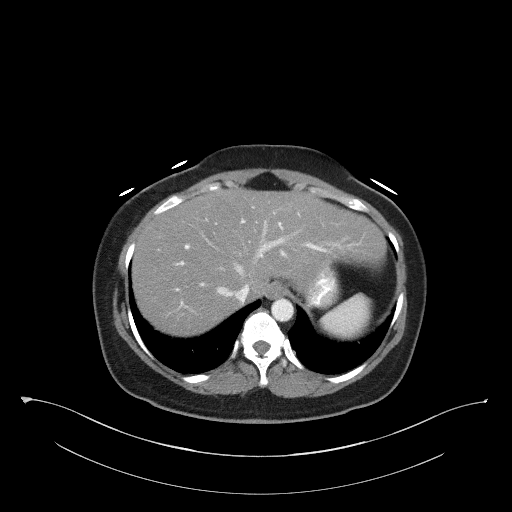
[im 98/104  soft-tissue]
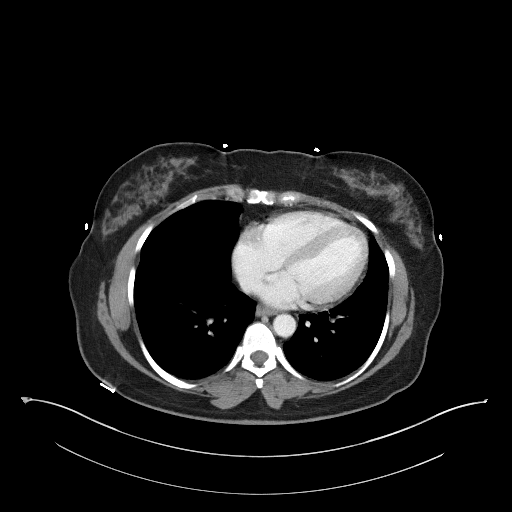

[Series 5: coronal st · coronal · 0.76mm/px · 3 of 95 slices shown]
[im 32/95  soft-tissue]
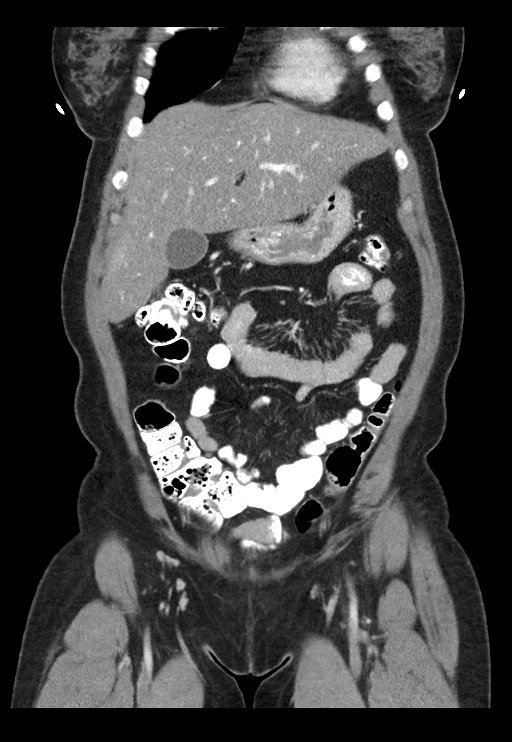
[im 42/95  soft-tissue]
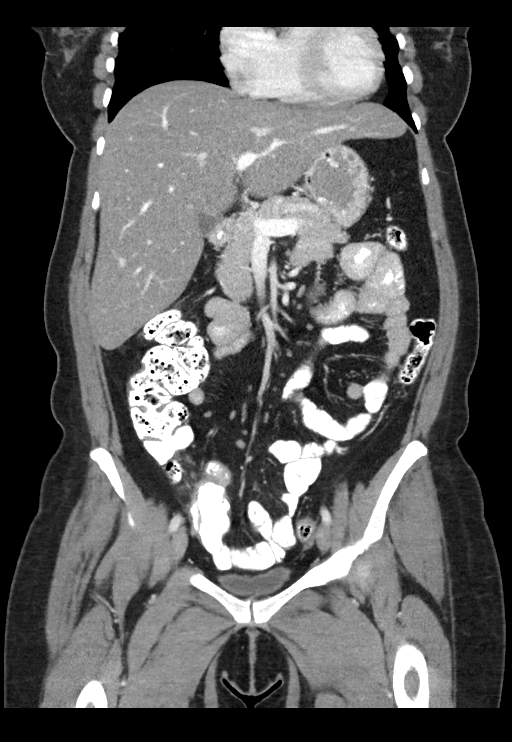
[im 53/95  soft-tissue]
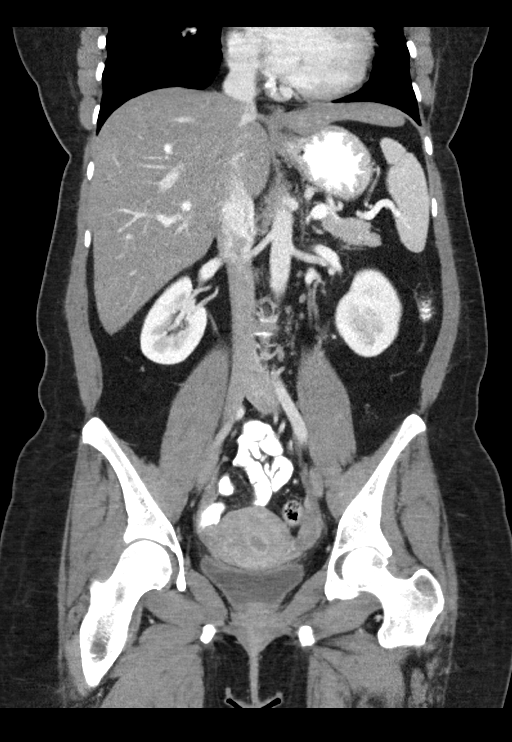

[16 of 46 positions shown; findings below may reference images not displayed]

FINDINGS: Lower chest: Lung bases clear.  No pleural or pericardial effusion.

Hepatobiliary: No focal liver abnormality is seen. No evidence of
hemorrhage or other acute abnormality is present. No gallstones,
gallbladder wall thickening, or biliary dilatation. There is diffuse
fatty infiltration of the liver. The liver measures 22 cm
craniocaudal.

Pancreas: Unremarkable. No pancreatic ductal dilatation or
surrounding inflammatory changes.

Spleen: Normal in size without focal abnormality.

Adrenals/Urinary Tract: Adrenal glands are unremarkable. Kidneys are
normal, without renal calculi, focal lesion, or hydronephrosis.
Bladder is unremarkable.

Stomach/Bowel: Stomach is within normal limits. Appendix appears
normal. No evidence of bowel wall thickening, distention, or
inflammatory changes.

Vascular/Lymphatic: No significant vascular findings are present. No
enlarged abdominal or pelvic lymph nodes.

Reproductive: Uterus and bilateral adnexa are unremarkable.

Other: None.

Musculoskeletal: Negative.
IMPRESSION: No acute abnormality. No evidence of complication related to liver
biopsy.

Hepatomegaly and diffuse fatty infiltration of the liver.

## 2021-07-15 ENCOUNTER — Telehealth: Payer: Self-pay | Admitting: *Deleted

## 2021-07-15 NOTE — Telephone Encounter (Signed)
Returned call from 3:04 PM. Left patient a message to call and schedule. ?

## 2021-07-21 ENCOUNTER — Ambulatory Visit: Payer: BC Managed Care – PPO | Admitting: Obstetrics & Gynecology

## 2021-07-21 ENCOUNTER — Ambulatory Visit (INDEPENDENT_AMBULATORY_CARE_PROVIDER_SITE_OTHER): Payer: BC Managed Care – PPO

## 2021-07-21 ENCOUNTER — Other Ambulatory Visit (HOSPITAL_COMMUNITY)
Admission: RE | Admit: 2021-07-21 | Discharge: 2021-07-21 | Disposition: A | Discharge status: Home / Self Care | Payer: BC Managed Care – PPO | Source: Ambulatory Visit | Attending: Obstetrics & Gynecology | Admitting: Obstetrics & Gynecology

## 2021-07-21 ENCOUNTER — Encounter: Payer: Self-pay | Admitting: Obstetrics & Gynecology

## 2021-07-21 ENCOUNTER — Other Ambulatory Visit: Payer: Self-pay | Admitting: Obstetrics & Gynecology

## 2021-07-21 VITALS — BP 128/87 | HR 93 | Ht 64.0 in | Wt 159.0 lb

## 2021-07-21 DIAGNOSIS — N921 Excessive and frequent menstruation with irregular cycle: Secondary | ICD-10-CM

## 2021-07-21 DIAGNOSIS — Z1231 Encounter for screening mammogram for malignant neoplasm of breast: Secondary | ICD-10-CM

## 2021-07-21 LAB — POCT URINE PREGNANCY: Preg Test, Ur: NEGATIVE

## 2021-07-21 NOTE — Progress Notes (Signed)
? ?  Subjective:  ? ? Patient ID: Danielle Nelson, female    DOB: [birth year], 51 y.o.   MRN: [id number] ? ?HPI ? ?51 yo female presents for menstrual irregularities.  Last menses was very heavy--leaking through super tampons.  Pt has also has spotting between menses.  She had no menstrual problems in 2022.  Her sister was diagnosed with stage 3 ovarian cancer last last year and she had vaginal bleeding.  Oldest sister also recently diagnosed with a uterine fibroid.  Pt is sexually active and does not use birth control ? ?Review of Systems  ?Constitutional: Negative.   ?Respiratory: Negative.    ?Cardiovascular: Negative.   ?Gastrointestinal: Negative.   ?Genitourinary:  Positive for menstrual problem and vaginal bleeding.  ? ?   ?Objective:  ? Physical Exam ?Vitals reviewed.  ?Constitutional:   ?   General: She is not in acute distress. ?   Appearance: She is well-developed.  ?HENT:  ?   Head: Normocephalic and atraumatic.  ?Eyes:  ?   Conjunctiva/sclera: Conjunctivae normal.  ?Cardiovascular:  ?   Rate and Rhythm: Normal rate.  ?Pulmonary:  ?   Effort: Pulmonary effort is normal.  ?Abdominal:  ?   General: Abdomen is flat.  ?   Palpations: Abdomen is soft.  ?Genitourinary: ?   Comments: Tanner V ?Vulva:  No lesion ?Vagina:  Pink, no lesions, no discharge, no blood ?Cervix:  No CMT ?Uterus:  Non tender, mobile ?Right adnexa--non tender, no mass ?Left adnexa--non tender, no mass ?Skin: ?   General: Skin is warm and dry.  ?Neurological:  ?   Mental Status: She is alert and oriented to person, place, and time.  ?Psychiatric:     ?   Mood and Affect: Mood normal.  ? ?Vitals:  ? 07/21/21 1119  ?BP: 128/87  ?Pulse: 93  ?Weight: 159 lb (72.1 kg)  ?Height: 5\' 4"  (1.626 m)  ? ?   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 ? ?ENDOMETRIAL BIOPSY      ?The indications for endometrial biopsy were reviewed.   Risks of the biopsy including cramping, bleeding, infection, uterine perforation, inadequate specimen and need for additional procedures  were discussed. The patient states she understands and agrees to undergo procedure today. Consent was signed. Time out was performed. Urine HCG was negative. ?A sterile speculum was placed in the patient's vagina and the cervix was prepped with Betadine. A single-toothed tenaculum was placed on the anterior lip of the cervix to stabilize it. The 3 mm pipelle was introduced into the endometrial cavity without difficulty to a depth of 8 cm, and a small amount of tissue was obtained and sent to pathology. The instruments were removed from the patient's vagina. Minimal bleeding from the cervix was noted. The patient tolerated the procedure well. Routine post-procedure instructions were given to the patient. The patient will follow up to review the results and for further management.   ? ? ?

## 2021-07-21 NOTE — Progress Notes (Signed)
Pt does not know exact dates of bleeding. Pt has been having spotting in between periods since the beginning of the year. ? ? ?

## 2021-07-23 LAB — SURGICAL PATHOLOGY

## 2021-07-28 ENCOUNTER — Encounter: Payer: Self-pay | Admitting: Obstetrics & Gynecology

## 2021-07-28 DIAGNOSIS — N920 Excessive and frequent menstruation with regular cycle: Secondary | ICD-10-CM | POA: Insufficient documentation

## 2021-07-31 ENCOUNTER — Ambulatory Visit (INDEPENDENT_AMBULATORY_CARE_PROVIDER_SITE_OTHER): Payer: BC Managed Care – PPO

## 2021-07-31 DIAGNOSIS — Z1231 Encounter for screening mammogram for malignant neoplasm of breast: Secondary | ICD-10-CM | POA: Diagnosis not present

## 2021-08-04 ENCOUNTER — Encounter: Payer: Self-pay | Admitting: Obstetrics & Gynecology

## 2021-08-04 ENCOUNTER — Ambulatory Visit: Payer: BC Managed Care – PPO | Admitting: Obstetrics & Gynecology

## 2021-08-04 VITALS — BP 125/78 | HR 82 | Ht 64.0 in | Wt 160.0 lb

## 2021-08-04 DIAGNOSIS — N8003 Adenomyosis of the uterus: Secondary | ICD-10-CM

## 2021-08-04 DIAGNOSIS — Z8041 Family history of malignant neoplasm of ovary: Secondary | ICD-10-CM | POA: Diagnosis not present

## 2021-08-04 DIAGNOSIS — N921 Excessive and frequent menstruation with irregular cycle: Secondary | ICD-10-CM

## 2021-08-04 NOTE — Progress Notes (Signed)
Subjective:    Patient ID: Danielle Nelson, female    DOB: 28-Oct-1970, 51 y.o.   MRN: [id number]  HPI  Danielle Nelson presents to review test results and discuss medical options.  Danielle Nelson had spotting just after the EMBx and has not had a menstrual cycle since before the procedure.    Biopsy  SURGICAL PATHOLOGY  CASE: [id number]  Clinical History: menometrorrhagia (cm)  FINAL MICROSCOPIC DIAGNOSIS:   A. ENDOMETRIUM, BIOPSY:  - Inactive endometrium with focal breakdown.  - No atypia, hyperplasia or carcinoma.   GROSS DESCRIPTION:  Received in formalin are 1.5 x 1.5 x 0.3 cm of soft tan-red tissue and  bloody mucus.  The specimen is entirely submitted in 1 cassette.  (GRP  07/22/2021)   ULTRASOUND  FINDINGS: Uterus  Measurements: 7.4 x 3.9 x 5.0 cm = volume: 75 mL. The uterus is anteverted. The cervix is unremarkable. The endometrial stripe is not well visualized and the endometrial/myometrial junction is poorly delineated, a nonspecific finding that can be seen in the setting of adenomyosis. No discrete intrauterine masses are seen.   Endometrium  As noted above, the endometrial stripe is not well visualized but measures roughly 6-10 mm on the presented images.   Right ovary Measurements: 2.6 x 1.5 x 1.5 cm = volume: 3 mL. Normal appearance/no adnexal mass.   Left ovary Measurements: 3.3 x 1.6 x 2.1 cm = volume: 6 mL. Normal appearance/no adnexal mass.   Pulsed Doppler evaluation of both ovaries demonstrates normal low-resistance arterial and venous waveforms.   Other findings No abnormal free fluid.   IMPRESSION: No acute abnormality.   Poor characterization of the endometrial stripe with poor delineation of the endometrial/myometrial junction. This is a nonspecific finding, but can be seen in the setting of underlying adenomyosis. If indicated, this could be confirmed with MRI examination.  Pt is not having hot flashes or flushes, no other symptom of menopause.  Pt's  sister recently diagnosed with Stage 3 ovarian cancer and her symptom was vaginal bleeding.  Danielle Nelson wants to be sure that Danielle Nelson does not have. Ovarian cancer.   Review of Systems  Constitutional: Negative.   Respiratory: Negative.    Cardiovascular: Negative.   Gastrointestinal: Negative.   Genitourinary: Negative.       Objective:   Physical Exam Vitals reviewed.  Constitutional:      General: Danielle Nelson is not in acute distress.    Appearance: Danielle Nelson is well-developed.  HENT:     Head: Normocephalic and atraumatic.  Eyes:     Conjunctiva/sclera: Conjunctivae normal.  Cardiovascular:     Rate and Rhythm: Normal rate.  Pulmonary:     Effort: Pulmonary effort is normal.  Skin:    General: Skin is warm and dry.  Neurological:     Mental Status: Danielle Nelson is alert and oriented to person, place, and time.  Psychiatric:        Mood and Affect: Mood normal.   Vitals:   08/04/21 1449  BP: 125/78  Pulse: 82  Weight: 160 lb (72.6 kg)  Height: 5\' 4"  (1.626 m)      Assessment & Plan:  51 yo female with menometrorrhagia.  No menses since 10 days before the last visit.    Biopsy is negative and shows inactive endometrium.  This correlates with the small amount of specimen after two passes with the pipelle.  Adenomyosis is a possibility based on 52.  This would also explain the heavy menses.  We discussed treatment options and my recommendation would  be a Mirena IUD.  Danielle Nelson would like to think about options.  Up to date patient information given on adenomyosis given.   Follow up prn  I provided 30 minutes of face to face and non-verbal time during this encounter date, time was needed to gather information, review chart, records, history and counseling with patient, as well as complete documentation.

## 2021-08-06 ENCOUNTER — Encounter: Payer: Self-pay | Admitting: Obstetrics & Gynecology

## 2021-08-06 DIAGNOSIS — Z8041 Family history of malignant neoplasm of ovary: Secondary | ICD-10-CM | POA: Insufficient documentation

## 2023-03-04 ENCOUNTER — Encounter: Payer: Self-pay | Admitting: Family Medicine

## 2023-03-04 ENCOUNTER — Ambulatory Visit: Payer: Managed Care, Other (non HMO)

## 2023-03-04 ENCOUNTER — Ambulatory Visit (INDEPENDENT_AMBULATORY_CARE_PROVIDER_SITE_OTHER): Payer: Managed Care, Other (non HMO) | Admitting: Family Medicine

## 2023-03-04 VITALS — BP 103/64 | HR 84 | Ht 64.0 in | Wt 169.6 lb

## 2023-03-04 DIAGNOSIS — Z0000 Encounter for general adult medical examination without abnormal findings: Secondary | ICD-10-CM | POA: Insufficient documentation

## 2023-03-04 DIAGNOSIS — Z1211 Encounter for screening for malignant neoplasm of colon: Secondary | ICD-10-CM

## 2023-03-04 DIAGNOSIS — Z1231 Encounter for screening mammogram for malignant neoplasm of breast: Secondary | ICD-10-CM

## 2023-03-04 NOTE — Progress Notes (Signed)
Established patient visit   Patient: Danielle Nelson   DOB: 13-Sep-1970   52 y.o. Female  MRN: [id number] Visit Date: 03/04/2023  Today's healthcare provider: Charlton Amor, DO   Chief Complaint  Patient presents with   New Patient (Initial Visit)    Establish Care    SUBJECTIVE    Chief Complaint  Patient presents with   New Patient (Initial Visit)    Establish Care   HPI HPI     New Patient (Initial Visit)    Additional comments: Establish Care      Last edited by Roselyn Reef, CMA on 03/04/2023 10:12 AM.      Pt presents to establish care. Said she has a pmh of prediabetes during pregnancy.   Family history significant for htn No family history of cancer   Pt is requesting a new patient visit and wellness.  Review of Systems  Constitutional:  Negative for activity change, fatigue and fever.  Respiratory:  Negative for cough and shortness of breath.   Cardiovascular:  Negative for chest pain.  Gastrointestinal:  Negative for abdominal pain.  Genitourinary:  Negative for difficulty urinating.       Current Meds  Medication Sig   nirmatrelvir/ritonavir (PAXLOVID, 300/100,) 20 x 150 MG & 10 x 100MG  TBPK Take 300 mg nirmatrelvir (two 150 mg tablets) with 100 mg ritonavir (one 100 mg tablet) by mouth twice daily for 5 days.   ondansetron (ZOFRAN-ODT) 4 MG disintegrating tablet Take by mouth.   promethazine-dextromethorphan (PROMETHAZINE-DM) 6.25-15 MG/5ML syrup 1-2 teaspoons every 6 hours as needed for cough mainly at bedtime.  Do not take and drive.    OBJECTIVE    BP 103/64 (BP Location: Left Wrist, Patient Position: Sitting)   Pulse 84   Ht 5\' 4"  (1.626 m)   Wt 169 lb 9 oz (76.9 kg)   SpO2 98%   BMI 29.11 kg/m   Physical Exam Vitals and nursing note reviewed.  Constitutional:      General: She is not in acute distress.    Appearance: Normal appearance.  HENT:     Head: Normocephalic and atraumatic.     Right Ear: External ear normal.      Left Ear: External ear normal.     Nose: Nose normal.  Eyes:     Conjunctiva/sclera: Conjunctivae normal.  Cardiovascular:     Rate and Rhythm: Normal rate and regular rhythm.  Pulmonary:     Effort: Pulmonary effort is normal.     Breath sounds: Normal breath sounds.  Abdominal:     General: Abdomen is flat. Bowel sounds are normal.     Palpations: Abdomen is soft.     Tenderness: There is no abdominal tenderness.  Neurological:     General: No focal deficit present.     Mental Status: She is alert and oriented to person, place, and time.  Psychiatric:        Mood and Affect: Mood normal.        Behavior: Behavior normal.        Thought Content: Thought content normal.        Judgment: Judgment normal.        ASSESSMENT & PLAN    Problem List Items Addressed This Visit       Other   Routine adult health maintenance - Primary   Relevant Orders   MM 3D SCREENING MAMMOGRAM BILATERAL BREAST   Ambulatory referral to Gastroenterology   CBC   Lipid panel  CMP14+EGFR   HgB A1c   Other Visit Diagnoses       Screening for colon cancer       Relevant Orders   Ambulatory referral to Gastroenterology     Encounter for screening mammogram for malignant neoplasm of breast       Relevant Orders   MM 3D SCREENING MAMMOGRAM BILATERAL BREAST      Pt alludes to some joint pain she would like to come back to address.  No follow-ups on file.      No orders of the defined types were placed in this encounter.   Orders Placed This Encounter  Procedures   MM 3D SCREENING MAMMOGRAM BILATERAL BREAST    Standing Status:   Future    Expiration Date:   03/03/2024    Reason for Exam (SYMPTOM  OR DIAGNOSIS REQUIRED):   screening for breast cancer    Is the patient pregnant?:   No    Preferred imaging location?:   [facility]   CBC   Lipid panel    Has the patient fasted?:   No    Release to patient:   Immediate   CMP14+EGFR    Has the patient fasted?:   No   HgB A1c    Ambulatory referral to Gastroenterology    Referral Priority:   Routine    Referral Type:   Consultation    Referral Reason:   Specialty Services Required    Number of Visits Requested:   1     Charlton Amor, DO  [facility] at [employer] [phone] (phone) [phone] (fax)  [facility]

## 2023-03-05 LAB — CMP14+EGFR
ALT: 115 [IU]/L — ABNORMAL HIGH (ref 0–32)
AST: 52 [IU]/L — ABNORMAL HIGH (ref 0–40)
Albumin: 4.5 g/dL (ref 3.9–4.9)
Alkaline Phosphatase: 97 [IU]/L (ref 44–121)
BUN/Creatinine Ratio: 21 (ref 9–23)
BUN: 14 mg/dL (ref 6–24)
Bilirubin Total: 0.3 mg/dL (ref 0.0–1.2)
CO2: 25 mmol/L (ref 20–29)
Calcium: 9.5 mg/dL (ref 8.7–10.2)
Chloride: 105 mmol/L (ref 96–106)
Creatinine, Ser: 0.68 mg/dL (ref 0.57–1.00)
Globulin, Total: 2.6 g/dL (ref 1.5–4.5)
Glucose: 134 mg/dL — ABNORMAL HIGH (ref 70–99)
Potassium: 4.5 mmol/L (ref 3.5–5.2)
Sodium: 145 mmol/L — ABNORMAL HIGH (ref 134–144)
Total Protein: 7.1 g/dL (ref 6.0–8.5)
eGFR: 106 mL/min/{1.73_m2} (ref >59)

## 2023-03-05 LAB — HEMOGLOBIN A1C
Est. average glucose Bld gHb Est-mCnc: 157 mg/dL
Hgb A1c MFr Bld: 7.1 % — ABNORMAL HIGH (ref 4.8–5.6)

## 2023-03-05 LAB — LIPID PANEL
Chol/HDL Ratio: 5.3 {ratio} — ABNORMAL HIGH (ref 0.0–4.4)
Cholesterol, Total: 200 mg/dL — ABNORMAL HIGH (ref 100–199)
HDL: 38 mg/dL — ABNORMAL LOW (ref >39)
LDL Chol Calc (NIH): 121 mg/dL — ABNORMAL HIGH (ref 0–99)
Triglycerides: 230 mg/dL — ABNORMAL HIGH (ref 0–149)
VLDL Cholesterol Cal: 41 mg/dL — ABNORMAL HIGH (ref 5–40)

## 2023-03-05 LAB — CBC
Hematocrit: 42.3 % (ref 34.0–46.6)
Hemoglobin: 13.8 g/dL (ref 11.1–15.9)
MCH: 28.1 pg (ref 26.6–33.0)
MCHC: 32.6 g/dL (ref 31.5–35.7)
MCV: 86 fL (ref 79–97)
Platelets: 209 10*3/uL (ref 150–450)
RBC: 4.91 x10E6/uL (ref 3.77–5.28)
RDW: 13.9 % (ref 11.7–15.4)
WBC: 5.3 10*3/uL (ref 3.4–10.8)

## 2023-03-25 ENCOUNTER — Encounter: Payer: Self-pay | Admitting: Family Medicine

## 2023-03-25 ENCOUNTER — Ambulatory Visit: Payer: Managed Care, Other (non HMO) | Admitting: Family Medicine

## 2023-03-25 VITALS — BP 141/83 | HR 79 | Ht 64.0 in | Wt 172.8 lb

## 2023-03-25 DIAGNOSIS — L299 Pruritus, unspecified: Secondary | ICD-10-CM

## 2023-03-25 DIAGNOSIS — M791 Myalgia, unspecified site: Secondary | ICD-10-CM | POA: Diagnosis not present

## 2023-03-25 DIAGNOSIS — E1165 Type 2 diabetes mellitus with hyperglycemia: Secondary | ICD-10-CM | POA: Diagnosis not present

## 2023-03-25 NOTE — Assessment & Plan Note (Addendum)
-   pt A1c at 7.1% indicating diagnosis of diabetes. Provided education today. She is hesitant to try any medications and would like to do a 3 month trial of lifestyle modifications. She says she is confident she can control her diet and cut out sugars.  - she does say her exercise is limited due to aches and pains that she would like addressed. Discussed risks of not taking medication and she is aware - if A1c is increased we will really need to encourage her to do metformin and rybelsus.

## 2023-03-25 NOTE — Patient Instructions (Signed)
 Try selsum blue   Look up metformin and rybelsus which is what would be appropriate treatment for diabetes

## 2023-03-25 NOTE — Progress Notes (Signed)
 Established patient visit   Patient: Danielle Nelson   DOB: April 15, 1970   53 y.o. Female  MRN: [id number] Visit Date: 03/25/2023  Today's healthcare provider: Bernice GORMAN [place], DO   Chief Complaint  Patient presents with   Medical Management of Chronic Issues    Discuss recent labs    SUBJECTIVE    Chief Complaint  Patient presents with   Medical Management of Chronic Issues    Discuss recent labs   HPI HPI     Medical Management of Chronic Issues    Additional comments: Discuss recent labs      Last edited by Duwaine Riggs, CMA on 03/25/2023  2:02 PM.       Pt presents to follow up on blood work results. Recent blood work shows A1c of 7.1% placing patient in diabetic category.   Review of Systems  Constitutional:  Negative for activity change, fatigue and fever.  Respiratory:  Negative for cough and shortness of breath.   Cardiovascular:  Negative for chest pain.  Gastrointestinal:  Negative for abdominal pain.  Genitourinary:  Negative for difficulty urinating.  Musculoskeletal:  Positive for arthralgias and myalgias.  Skin:        Also notes intense burning in her scalp       No outpatient medications have been marked as taking for the 03/25/23 encounter (Office Visit) with [place] Bernice GORMAN, DO.    OBJECTIVE    BP (!) 141/83 (BP Location: Left Arm, Patient Position: Sitting, Cuff Size: Large)   Pulse 79   Ht 5' 4 (1.626 m)   Wt 172 lb 12 oz (78.4 kg)   SpO2 97%   BMI 29.65 kg/m   Physical Exam Vitals and nursing note reviewed.  Constitutional:      General: She is not in acute distress.    Appearance: Normal appearance.  HENT:     Head: Normocephalic and atraumatic.     Right Ear: External ear normal.     Left Ear: External ear normal.     Nose: Nose normal.  Eyes:     Conjunctiva/sclera: Conjunctivae normal.  Cardiovascular:     Rate and Rhythm: Normal rate.  Pulmonary:     Effort: Pulmonary effort is normal.  Skin:    Comments: No dandruff  seen on scalp  Neurological:     General: No focal deficit present.     Mental Status: She is alert and oriented to person, place, and time.  Psychiatric:        Mood and Affect: Mood normal.        Behavior: Behavior normal.        Thought Content: Thought content normal.        Judgment: Judgment normal.        ASSESSMENT & PLAN    Problem List Items Addressed This Visit       Endocrine   Type 2 diabetes mellitus with hyperglycemia, without long-term current use of insulin (HCC) - Primary   - pt A1c at 7.1% indicating diagnosis of diabetes. Provided education today. She is hesitant to try any medications and would like to do a 3 month trial of lifestyle modifications. She says she is confident she can control her diet and cut out sugars.  - she does say her exercise is limited due to aches and pains that she would like addressed. Discussed risks of not taking medication and she is aware - if A1c is increased we will really need to encourage  her to do metformin and rybelsus.        Musculoskeletal and Integument   Pruritus of scalp   Recommended selsum blue shampoo. Pt says the itching is very intense and she thinks she has already tried this. Discuss derm referral      Relevant Orders   Ambulatory referral to Dermatology     Other   Myalgia   Pt notes intense myalgias and has trouble with holding up an umbrella or brushing her hair sometimes. Will go ahead and order autoimmune markers as well as inflammatory markers to see if there is a component of arthritis vs PMR      Relevant Orders   ANA,IFA RA Diag Pnl w/rflx Tit/Patn   Sed Rate (ESR)   C-reactive protein    Return in about 10 weeks (around 06/03/2023) for a1c check.      No orders of the defined types were placed in this encounter.   Orders Placed This Encounter  Procedures   ANA,IFA RA Diag Pnl w/rflx Tit/Patn   Sed Rate (ESR)   C-reactive protein   Ambulatory referral to Dermatology    Referral  Priority:   Routine    Referral Type:   Consultation    Referral Reason:   Specialty Services Required    Requested Specialty:   Dermatology    Number of Visits Requested:   1     Bernice GORMAN [place]au, DO  [facility] at [facility] [phone] (phone) [phone] (fax)  [facility]

## 2023-03-25 NOTE — Assessment & Plan Note (Signed)
 Pt notes intense myalgias and has trouble with holding up an umbrella or brushing her hair sometimes. Will go ahead and order autoimmune markers as well as inflammatory markers to see if there is a component of arthritis vs PMR

## 2023-03-25 NOTE — Assessment & Plan Note (Signed)
 Recommended selsum blue shampoo. Pt says the itching is very intense and she thinks she has already tried this. Discuss derm referral

## 2023-03-28 LAB — SEDIMENTATION RATE: Sed Rate: 32 mm/h (ref 0–40)

## 2023-03-28 LAB — ANA,IFA RA DIAG PNL W/RFLX TIT/PATN
Cyclic Citrullin Peptide Ab: 7 U (ref 0–19)
Rheumatoid fact SerPl-aCnc: <10 [IU]/mL (ref <14.0)

## 2023-03-28 LAB — C-REACTIVE PROTEIN: CRP: 6 mg/L (ref 0–10)

## 2023-06-03 ENCOUNTER — Ambulatory Visit: Payer: Managed Care, Other (non HMO) | Admitting: Family Medicine

## 2023-06-10 ENCOUNTER — Encounter: Payer: Self-pay | Admitting: Family Medicine

## 2023-06-10 ENCOUNTER — Ambulatory Visit: Payer: Managed Care, Other (non HMO) | Admitting: Family Medicine

## 2023-06-10 VITALS — BP 121/75 | HR 69 | Temp 98.7°F | Ht 64.0 in | Wt 164.4 lb

## 2023-06-10 DIAGNOSIS — E1165 Type 2 diabetes mellitus with hyperglycemia: Secondary | ICD-10-CM

## 2023-06-10 LAB — POCT GLYCOSYLATED HEMOGLOBIN (HGB A1C): Hemoglobin A1C: 6.7 % — AB (ref 4.0–5.6)

## 2023-06-10 NOTE — Progress Notes (Signed)
 Established Patient Office Visit  Subjective   Patient ID: Danielle Nelson, female    DOB: 1970/06/20  Age: 53 y.o. MRN: [id number]  Chief Complaint  Patient presents with   Diabetes    HPI Diabetes: Medication compliance: Not taking medication currently. Working on lifestyle.  Denies chest pain, shortness of breath, vision changes, polydipsia, polyphagia, polyuria. Denies hypoglycemia.  Pertinent lab work: A1C: POC A1C 6.7 Monitoring: blood sugar readings at home: has spikes with meals, fasting blood sugars 150's           Continue current medication regimen: Does not want to go on medication.   Well controlled: continue with lifestyle modicaiton   Follow-up: 3 months with PCP      ROS    Objective:     BP 121/75   Pulse 69   Temp 98.7 F (37.1 C)   Ht 5\' 4"  (1.626 m)   Wt 164 lb 7 oz (74.6 kg)   SpO2 98%   BMI 28.23 kg/m  BP Readings from Last 3 Encounters:  06/10/23 121/75  03/25/23 (!) 141/83  03/04/23 103/64      Physical Exam Vitals and nursing note reviewed.  Constitutional:      General: She is not in acute distress.    Appearance: Normal appearance.  Cardiovascular:     Rate and Rhythm: Normal rate and regular rhythm.     Heart sounds: Normal heart sounds.  Pulmonary:     Effort: Pulmonary effort is normal.     Breath sounds: Normal breath sounds.  Skin:    General: Skin is warm and dry.  Neurological:     General: No focal deficit present.     Mental Status: She is alert. Mental status is at baseline.  Psychiatric:        Mood and Affect: Mood normal.        Behavior: Behavior normal.        Thought Content: Thought content normal.        Judgment: Judgment normal.     Results for orders placed or performed in visit on 06/10/23  POCT HgB A1C  Result Value Ref Range   Hemoglobin A1C 6.7 (A) 4.0 - 5.6 %   HbA1c POC (<> result, manual entry)     HbA1c, POC (prediabetic range)     HbA1c, POC (controlled diabetic range)      Last  hemoglobin A1c Lab Results  Component Value Date   HGBA1C 6.7 (A) 06/10/2023      The 10-year ASCVD risk score (Arnett DK, et al., 2019) is: 3.4%    Assessment & Plan:   Problem List Items Addressed This Visit     Type 2 diabetes mellitus with hyperglycemia, without long-term current use of insulin (HCC) - Primary   Not currently taking mediation for type 2 diabetes. POC A1C has improved to 6.7 today. Wearing continuous glucose monitor. Learning trends and how certain foods affect her blood sugar.  Recommend mediterranean diet, cardio, and strength training at least twice per week to build lean muscle mass.  Wants to continue to manage this with diet and exercise, this is reasonable considering the improvement in her A1C in 10 weeks.  Urine for microalbumin. CMP Ophthalmology referral.  Encouraged her to make appointment with GI for colonoscopy.  Follow-up with PCP in 3 months, sooner if needed.        Relevant Orders   POCT HgB A1C (Completed)   Ambulatory referral to Ophthalmology   Microalbumin /  creatinine urine ratio   Comprehensive metabolic panel with GFR  Agrees with plan of care discussed.  Questions answered.  Total face to face time discussing lifestyle changes to include diet and exercise: 30 minutes.   Return in about 3 months (around 09/10/2023) for DM with PCP .    Novella Olive, FNP

## 2023-06-10 NOTE — Assessment & Plan Note (Addendum)
 Not currently taking mediation for type 2 diabetes. POC A1C has improved to 6.7 today. Wearing continuous glucose monitor. Learning trends and how certain foods affect her blood sugar.  Recommend mediterranean diet, cardio, and strength training at least twice per week to build lean muscle mass.  Wants to continue to manage this with diet and exercise, this is reasonable considering the improvement in her A1C in 10 weeks.  Urine for microalbumin. CMP Ophthalmology referral.  Encouraged her to make appointment with GI for colonoscopy.  Follow-up with PCP in 3 months, sooner if needed.

## 2023-06-10 NOTE — Patient Instructions (Signed)
Target blood sugar goals:   Fasting glucose 80-130 mg/dl Postprandial glucose (90-120 minutes after the meal) less than 180 mg/dl

## 2023-06-11 ENCOUNTER — Encounter: Payer: Self-pay | Admitting: Family Medicine

## 2023-06-11 LAB — COMPREHENSIVE METABOLIC PANEL WITH GFR
ALT: 64 IU/L — ABNORMAL HIGH (ref 0–32)
AST: 37 IU/L (ref 0–40)
Albumin: 4.9 g/dL (ref 3.9–4.9)
Alkaline Phosphatase: 101 IU/L (ref 44–121)
BUN/Creatinine Ratio: 13 (ref 9–23)
BUN: 9 mg/dL (ref 6–24)
Bilirubin Total: 0.3 mg/dL (ref 0.0–1.2)
CO2: 25 mmol/L (ref 20–29)
Calcium: 9.9 mg/dL (ref 8.7–10.2)
Chloride: 102 mmol/L (ref 96–106)
Creatinine, Ser: 0.67 mg/dL (ref 0.57–1.00)
Globulin, Total: 2.4 g/dL (ref 1.5–4.5)
Glucose: 96 mg/dL (ref 70–99)
Potassium: 4.2 mmol/L (ref 3.5–5.2)
Sodium: 144 mmol/L (ref 134–144)
Total Protein: 7.3 g/dL (ref 6.0–8.5)
eGFR: 106 mL/min/{1.73_m2} (ref >59)

## 2023-06-11 LAB — MICROALBUMIN / CREATININE URINE RATIO
Creatinine, Urine: 118.9 mg/dL
Microalb/Creat Ratio: 5 mg/g{creat} (ref 0–29)
Microalbumin, Urine: 6.5 ug/mL

## 2023-07-22 ENCOUNTER — Encounter: Payer: Self-pay | Admitting: Family Medicine

## 2023-09-09 ENCOUNTER — Ambulatory Visit: Admitting: Urgent Care

## 2023-09-10 NOTE — Progress Notes (Signed)
 Erroneous encounter. Pt was not seen. A1C testing would have been provided too soon thus patient rescheduled.

## 2023-09-16 ENCOUNTER — Encounter: Payer: Self-pay | Admitting: Urgent Care

## 2023-09-16 ENCOUNTER — Ambulatory Visit (INDEPENDENT_AMBULATORY_CARE_PROVIDER_SITE_OTHER): Admitting: Urgent Care

## 2023-09-16 VITALS — BP 121/77 | HR 74 | Resp 18 | Ht 64.0 in | Wt 165.2 lb

## 2023-09-16 DIAGNOSIS — M791 Myalgia, unspecified site: Secondary | ICD-10-CM

## 2023-09-16 DIAGNOSIS — Z8639 Personal history of other endocrine, nutritional and metabolic disease: Secondary | ICD-10-CM

## 2023-09-16 DIAGNOSIS — R7303 Prediabetes: Secondary | ICD-10-CM | POA: Diagnosis not present

## 2023-09-16 DIAGNOSIS — M2559 Pain in other specified joint: Secondary | ICD-10-CM

## 2023-09-16 DIAGNOSIS — L219 Seborrheic dermatitis, unspecified: Secondary | ICD-10-CM

## 2023-09-16 DIAGNOSIS — R1310 Dysphagia, unspecified: Secondary | ICD-10-CM

## 2023-09-16 DIAGNOSIS — R202 Paresthesia of skin: Secondary | ICD-10-CM

## 2023-09-16 DIAGNOSIS — R945 Abnormal results of liver function studies: Secondary | ICD-10-CM

## 2023-09-16 LAB — POCT GLYCOSYLATED HEMOGLOBIN (HGB A1C): Hemoglobin A1C: 6.4 % — AB (ref 4.0–5.6)

## 2023-09-16 MED ORDER — KETOCONAZOLE 2 % EX SHAM: MEDICATED_SHAMPOO | CUTANEOUS | 11 refills | Status: AC

## 2023-09-16 NOTE — Progress Notes (Signed)
 Established Patient Office Visit  Subjective:  Patient ID: Danielle Nelson, female    DOB: 10/06/1970  Age: 53 y.o. MRN: [id number]  Chief Complaint  Patient presents with   Medical Management of Chronic Issues    T2DM last A1C 6.7    Pleasant 53yo female presents today with several concerns that have been going on for quite some time. Pt states that for the past 20+ years, she consistently has body and joint pain. She enjoys exercise, but something as simple as pushing her hand into the wall for a push up causes significant discomfort and pain. States she has had numerous labs in the past (inflammatory markers, autoimmune markers) and has always been told everything is normal. Pt is frustrated and discouraged as she feels she cannot live a normal life with her current symptoms. She was dx with DM after having an A1C of 7.1% 6 months ago. She did not take any medications and states she changed her diet in addition to taking numerous supplements to help (turmeric, cinnamon, amongst others). A1C today 6.4%. Pt also complaints of intermittent paresthesia symptoms, much worse after exercise or pressure on certain areas. Pt does have a rash, primarily on face and scalp. Has seen dermatology in the past, states she was told it was dandruff and tried OTC selsun blue, without relief. States the rash on the face is itchy, red and flaky. PT was following with gastroenterology back in 2021, noted that she had a positive antimitochondrial antibody at that time. It appears that workup at that time was recommended to have liver bx, but appears that somehow patient was lost to follow up and this was never completed. Pt has not seen GI since that time. Additionally, pt reports having been dx with a thyroid disorder shortly after birth of her youngest child. Recently, she has been complaining of swelling in her neck, feeling like her necklaces are too tight, and dysphagia.  Pt is not currently on any Rx medications  and only takes OTC supplements to help with her sugar levels.    Patient Active Problem List   Diagnosis Date Noted   Type 2 diabetes mellitus with hyperglycemia, without long-term current use of insulin (HCC) 03/25/2023   Myalgia 03/25/2023   Pruritus of scalp 03/25/2023   Routine adult health maintenance 03/04/2023   Family history of ovarian cancer 08/06/2021   Menorrhagia 07/28/2021   RUQ pain 08/25/2019   Arthralgia 06/20/2019   H/o Lyme disease 06/19/2019   Onychomycosis due to dermatophyte 11/02/2016   Urge incontinence 10/12/2013   Bladder cystocele 09/29/2013   Irritable bowel syndrome (IBS) 12/13/2012   Perimenopausal symptoms 12/13/2012   Anomaly, 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 around 2008   Social History   Tobacco Use   Smoking status: Never   Smokeless tobacco: Never  Vaping Use   Vaping status: Never Used  Substance Use Topics   Alcohol use: Not Currently   Drug use: Never      ROS: as noted in HPI  Objective:     BP 121/77 (BP Location: Left Arm, Patient Position: Sitting, Cuff Size:  Normal)   Pulse 74   Resp 18   Ht 5' 4 (1.626 m)   Wt 165 lb 4 oz (75 kg)   SpO2 99%   BMI 28.37 kg/m  BP Readings from Last 3 Encounters:  09/16/23 121/77  06/10/23 121/75  03/25/23 (!) 141/83   Wt Readings from Last 3 Encounters:  09/16/23 165 lb 4 oz (75 kg)  06/10/23 164 lb 7 oz (74.6 kg)  03/25/23 172 lb 12 oz (78.4 kg)      Physical Exam Vitals and nursing note reviewed.  Constitutional:      General: She is not in acute distress.    Appearance: Normal appearance. She is normal weight. She is  not ill-appearing, toxic-appearing or diaphoretic.  HENT:     Head: Normocephalic and atraumatic.     Right Ear: External ear normal.     Left Ear: External ear normal.     Nose: Nose normal.     Mouth/Throat:     Mouth: Mucous membranes are moist.     Pharynx: Oropharynx is clear. No oropharyngeal exudate or posterior oropharyngeal erythema.  Eyes:     General: No scleral icterus.       Right eye: No discharge.        Left eye: No discharge.     Extraocular Movements: Extraocular movements intact.     Pupils: Pupils are equal, round, and reactive to light.  Cardiovascular:     Rate and Rhythm: Normal rate and regular rhythm.     Heart sounds: No murmur heard.    No friction rub. No gallop.  Pulmonary:     Effort: Pulmonary effort is normal. No respiratory distress.     Breath sounds: Normal breath sounds. No stridor. No wheezing or rhonchi.  Abdominal:     General: Abdomen is flat. Bowel sounds are normal. There is no distension.     Palpations: Abdomen is soft. There is no mass.     Tenderness: There is no abdominal tenderness. There is no right CVA tenderness, left CVA tenderness, guarding or rebound.     Hernia: No hernia is present.  Musculoskeletal:     Cervical back: Normal range of motion and neck supple. No rigidity or tenderness.     Right lower leg: No edema.     Left lower leg: No edema.  Lymphadenopathy:     Cervical: No cervical adenopathy.  Skin:    General: Skin is warm and dry.     Coloration: Skin is not jaundiced.     Findings: Rash (erythematous rash to chin and on forehead near hair line; extends into scalp with fine scaling) present. No bruising.  Neurological:     General: No focal deficit present.     Mental Status: She is alert and oriented to person, place, and time.  Psychiatric:        Mood and Affect: Mood normal.        Behavior: Behavior normal.      Results for orders placed or performed in visit on 09/16/23  POCT HgB A1C  Result Value  Ref Range   Hemoglobin A1C 6.4 (A) 4.0 - 5.6 %   HbA1c POC (<> result, manual entry)     HbA1c, POC (prediabetic range)     HbA1c, POC (controlled diabetic range)      Last CBC Lab Results  Component Value Date   WBC 5.3 03/04/2023   HGB 13.8 03/04/2023   HCT 42.3 03/04/2023   MCV 86  03/04/2023   MCH 28.1 03/04/2023   RDW 13.9 03/04/2023   PLT 209 03/04/2023   Last metabolic panel Lab Results  Component Value Date   GLUCOSE 96 06/10/2023   NA 144 06/10/2023   K 4.2 06/10/2023   CL 102 06/10/2023   CO2 25 06/10/2023   BUN 9 06/10/2023   CREATININE 0.67 06/10/2023   EGFR 106 06/10/2023   CALCIUM 9.9 06/10/2023   PROT 7.3 06/10/2023   ALBUMIN 4.9 06/10/2023   LABGLOB 2.4 06/10/2023   BILITOT 0.3 06/10/2023   ALKPHOS 101 06/10/2023   AST 37 06/10/2023   ALT 64 (H) 06/10/2023   Last lipids Lab Results  Component Value Date   CHOL 200 (H) 03/04/2023   HDL 38 (L) 03/04/2023   LDLCALC 121 (H) 03/04/2023   TRIG 230 (H) 03/04/2023   CHOLHDL 5.3 (H) 03/04/2023   Last hemoglobin A1c Lab Results  Component Value Date   HGBA1C 6.4 (A) 09/16/2023   Last thyroid functions Lab Results  Component Value Date   TSH 1.21 06/09/2019   Last vitamin D No results found for: 25OHVITD2, 25OHVITD3, VD25OH Last vitamin B12 and Folate No results found for: VITAMINB12, FOLATE    The 10-year ASCVD risk score (Arnett DK, et al., 2019) is: 3.6%  Assessment & Plan:  Seborrheic dermatitis -     Ketoconazole; Apply to scalp and face twice a week for 8 weeks and then weekly thereafter. Lather, leave on for 5 minutes, then rinse off.  Dispense: 120 mL; Refill: 11  Prediabetes -     POCT glycosylated hemoglobin (Hb A1C)  Pain in other joint -     ANA+ENA+DNA/DS+Scl 70+SjoSSA/B -     Mitochondrial/smooth muscle ab pnl -     RNP Antibodies  Muscle pain -     ANA+ENA+DNA/DS+Scl 70+SjoSSA/B -     RNP Antibodies  Paresthesia -     ANA+ENA+DNA/DS+Scl 70+SjoSSA/B -      B12 and Folate Panel -     Vitamin B1 -     RNP Antibodies  Abnormal liver function -     Mitochondrial/smooth muscle ab pnl  History of thyroid disorder -     TSH+T4F+T3Free+ThyAbs+TPO+VD25  Dysphagia, unspecified type -     TSH+T4F+T3Free+ThyAbs+TPO+VD25  Pt has numerous chronic symptoms, all of which are worsening and she doesn't have answers for.   I am concerned regarding the positive anti-mitochondrial ab test that was positive in 2021. This was never followed up. Liver Bx was recommended, but never completed. Will recheck this again today. Her muscle pain/ joint pain could be a sx of PBC.  Rash looks most like seborrheic dermatitis given appearance and location. Pt has had normal CK and inflammatory markers in the past thus I do not think a DM/ PM/ [employer] workup is indicated at present time. Will start ketoconazole shampoo and follow up.  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 With new onset dysphagia, will obtain further labs. Consider thyroid US  pending results, although I do not feel a goiter or nodules on exam.   Chronic worsening joint and muscle pain. Had negative ANA and RF labs last year. MCTD and SLE never ruled out - will obtain more specific testing for this today.   Return in about 6 months (around 03/18/2024).   Benton LITTIE Gave, PA

## 2023-09-16 NOTE — Patient Instructions (Signed)
 Great job on the A1C reduction!!! You are no longer in the diabetic range. Continue the cinnamon! :)  We drew labs to further assess your symptoms   Please start using ketoconazole shampoo to your scalp and face - lather, lave on for 5 minutes and rinse. Do this twice weekly.  Return in 6 months, sooner as needed pending lab results

## 2023-09-20 ENCOUNTER — Ambulatory Visit: Payer: Self-pay | Admitting: Urgent Care

## 2023-09-20 DIAGNOSIS — E559 Vitamin D deficiency, unspecified: Secondary | ICD-10-CM

## 2023-09-20 MED ORDER — VITAMIN D (ERGOCALCIFEROL) 1.25 MG (50000 UNIT) PO CAPS: 50000.0000 [IU] | ORAL_CAPSULE | ORAL | 0 refills | Status: DC

## 2023-09-22 LAB — ANA+ENA+DNA/DS+SCL 70+SJOSSA/B
ANA Titer 1: NEGATIVE
ENA RNP Ab: <0.2 AI (ref 0.0–0.9)
ENA SM Ab Ser-aCnc: <0.2 AI (ref 0.0–0.9)
ENA SSA (RO) Ab: <0.2 AI (ref 0.0–0.9)
ENA SSB (LA) Ab: <0.2 AI (ref 0.0–0.9)
Scleroderma (Scl-70) (ENA) Antibody, IgG: <0.2 AI (ref 0.0–0.9)
dsDNA Ab: <1 [IU]/mL (ref 0–9)

## 2023-09-22 LAB — MITOCHONDRIAL/SMOOTH MUSCLE AB PNL
Mitochondrial Ab: <20 U (ref 0.0–20.0)
Smooth Muscle Ab: 2 U (ref 0–19)

## 2023-09-22 LAB — B12 AND FOLATE PANEL
Folate: 18.2 ng/mL (ref >3.0)
Vitamin B-12: 320 pg/mL (ref 232–1245)

## 2023-09-22 LAB — TSH+T4F+T3FREE+THYABS+TPO+VD25
Free T4: 0.99 ng/dL (ref 0.82–1.77)
T3, Free: 3 pg/mL (ref 2.0–4.4)
TSH: 0.763 u[IU]/mL (ref 0.450–4.500)
Thyroglobulin Antibody: <1 [IU]/mL (ref 0.0–0.9)
Thyroperoxidase Ab SerPl-aCnc: 9 [IU]/mL (ref 0–34)
Vit D, 25-Hydroxy: 12.8 ng/mL — ABNORMAL LOW (ref 30.0–100.0)

## 2023-09-22 LAB — VITAMIN B1: Thiamine: 111 nmol/L (ref 66.5–200.0)

## 2023-11-24 ENCOUNTER — Telehealth: Payer: Self-pay

## 2023-11-24 DIAGNOSIS — L299 Pruritus, unspecified: Secondary | ICD-10-CM

## 2023-11-24 DIAGNOSIS — R21 Rash and other nonspecific skin eruption: Secondary | ICD-10-CM

## 2023-11-24 NOTE — Telephone Encounter (Signed)
 Copied from CRM [id number]. Topic: Clinical - Medical Advice >> Nov 24, 2023  3:14 PM Diannia H wrote: Reason for CRM: Patient came in and seen Benton Gave and she gave her a rx for ketoconazole  (NIZORAL ) 2 % shampoo and the patient states its not working. She is wanting to go to a specialist or something because the trying shampoo after shampoo is just not working. The area is getting worse and she can't go mins without it bothering with her. Could you assist? Patients callback number is [phone].

## 2023-11-26 MED ORDER — PIMECROLIMUS 1 % EX CREA: TOPICAL_CREAM | Freq: Every day | CUTANEOUS | 0 refills | Status: AC

## 2023-11-26 NOTE — Telephone Encounter (Signed)
 Usually seborrheic dermatitis is not intensely itchy it can have a little bit itch especially if you get hot or sweaty.  So that part is a little unusual.  I will go ahead and send over a topical Elidel  still continue to use the shampoo to cleanse the skin in the affected area and then can apply a thin layer of Elidel  to the areas on her face and see if over the next 2 to 3 weeks if she is noticing some improvement.  We can also call our referral partners or send them a note and let them know that we need to find a different dermatology office that is not booked out until April.  3 different offices in [place] so hopefully one of them has better access.

## 2023-11-30 ENCOUNTER — Other Ambulatory Visit (HOSPITAL_COMMUNITY): Payer: Self-pay

## 2023-11-30 ENCOUNTER — Telehealth: Payer: Self-pay

## 2023-11-30 NOTE — Telephone Encounter (Signed)
 Pharmacy Patient Advocate Encounter   Received notification from CoverMyMeds that prior authorization for Pimecrolimus  1% cream is required/requested.   Insurance verification completed.   The patient is insured through [employer] .   Per test claim: PA required; PA submitted to above mentioned insurance via Latent Key/confirmation #/EOC [id number] Status is pending

## 2023-11-30 NOTE — Telephone Encounter (Signed)
 Pharmacy Patient Advocate Encounter  Received notification from [employer] that Prior Authorization for Pimecrolimus  1% cream has been APPROVED from 10/31/23 to 11/29/24   PA #/Case ID/Reference #: [id number]  Left a message at [facility] to notify of the approval

## 2024-03-23 ENCOUNTER — Ambulatory Visit: Admitting: Urgent Care

## 2024-03-23 VITALS — BP 118/77 | HR 76 | Ht 64.0 in | Wt 177.0 lb

## 2024-03-23 DIAGNOSIS — L409 Psoriasis, unspecified: Secondary | ICD-10-CM | POA: Diagnosis not present

## 2024-03-23 DIAGNOSIS — M791 Myalgia, unspecified site: Secondary | ICD-10-CM | POA: Diagnosis not present

## 2024-03-23 DIAGNOSIS — R202 Paresthesia of skin: Secondary | ICD-10-CM | POA: Diagnosis not present

## 2024-03-23 DIAGNOSIS — L299 Pruritus, unspecified: Secondary | ICD-10-CM

## 2024-03-23 DIAGNOSIS — E1165 Type 2 diabetes mellitus with hyperglycemia: Secondary | ICD-10-CM

## 2024-03-23 DIAGNOSIS — E559 Vitamin D deficiency, unspecified: Secondary | ICD-10-CM | POA: Diagnosis not present

## 2024-03-23 DIAGNOSIS — R140 Abdominal distension (gaseous): Secondary | ICD-10-CM

## 2024-03-23 DIAGNOSIS — Z23 Encounter for immunization: Secondary | ICD-10-CM

## 2024-03-23 MED ORDER — CLOBETASOL PROPIONATE 0.05 % EX SOLN: 1.0000 | Freq: Two times a day (BID) | CUTANEOUS | 0 refills | Status: AC

## 2024-03-23 MED ORDER — VITAMIN D (ERGOCALCIFEROL) 1.25 MG (50000 UNIT) PO CAPS: 50000.0000 [IU] | ORAL_CAPSULE | ORAL | 0 refills | Status: AC

## 2024-03-23 NOTE — Progress Notes (Signed)
 "  Established Patient Office Visit  Subjective:  Patient ID: Danielle Nelson, female    DOB: 10/08/1970  Age: 54 y.o. MRN: [id number]  Chief Complaint  Patient presents with   Follow-up    fasting    53yo pt presents for follow up. She has hx of DM but does not take any medications for it, prefering natural methods of glucose reduction. She is also mindful of her diet. Does report suspected worsening of A1C due to slight dietary indiscretion around the holidays and mild weight gain. She is requesting a cgm, has used dexcom in the past which has helped her understand the impact of her food choices on her glucose.  She continues to experience chronic muscle and joint pain, present for years. Had workups in the past, with no formal dx to date. Has had mixed lab results, sometimes showing positive ESR, CRP, and autoimmune markers, and other times not. Her most recent workup in July 2025 was unremarkable apart from low Vit D which she did not end up filling nor taking.  She does have a itchy rash on the R scalp. Was prescribed two different steroids (a topical solution and foam) neither of which pt picked up as she was concerned about side effects/ glucose effects on meds. She does have widespread joint pain. Hx of lymes disease.   She continues to experience abdominal symptoms as well and describes a sensation of feeling bloated. Does have hx of IBS.    Patient Active Problem List   Diagnosis Date Noted   Type 2 diabetes mellitus with hyperglycemia, without long-term current use of insulin (HCC) 03/25/2023   Myalgia 03/25/2023   Pruritus of scalp 03/25/2023   Routine adult health maintenance 03/04/2023   Family history of ovarian cancer 08/06/2021   Menorrhagia 07/28/2021   RUQ pain 08/25/2019   Arthralgia 06/20/2019   H/o Lyme disease 06/19/2019   Onychomycosis due to dermatophyte 11/02/2016   Urge incontinence 10/12/2013   Bladder cystocele 09/29/2013   Irritable bowel syndrome (IBS)  12/13/2012   Perimenopausal symptoms 12/13/2012   Anomaly, 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 around 2008   Social History[1]    ROS: as noted in HPI  Objective:     BP 118/77   Pulse 76   Ht 5' 4 (1.626 m)   Wt 177 lb (80.3 kg)   SpO2 97%   BMI 30.38 kg/m  BP Readings from Last 3 Encounters:  03/23/24 118/77  09/16/23 121/77  06/10/23 121/75   Wt Readings from Last 3 Encounters:  03/23/24 177 lb (80.3 kg)  09/16/23 165 lb 4 oz (75 kg)  06/10/23 164 lb 7 oz (74.6 kg)      Physical Exam Vitals and nursing note reviewed.  Constitutional:      General: She is not in acute distress.    Appearance: Normal appearance. She is normal weight. She is not ill-appearing, toxic-appearing or diaphoretic.  HENT:     Head: Normocephalic and atraumatic.     Right Ear: External ear normal.     Left Ear: External ear normal.     Nose:  Nose normal.     Mouth/Throat:     Mouth: Mucous membranes are moist.     Pharynx: Oropharynx is clear. No oropharyngeal exudate or posterior oropharyngeal erythema.  Eyes:     General: No scleral icterus.       Right eye: No discharge.        Left eye: No discharge.     Extraocular Movements: Extraocular movements intact.     Pupils: Pupils are equal, round, and reactive to light.  Cardiovascular:     Rate and Rhythm: Normal rate and regular rhythm.     Heart sounds: No murmur heard.    No friction rub. No gallop.  Pulmonary:     Effort: Pulmonary effort is normal. No respiratory distress.     Breath sounds: Normal breath sounds. No stridor. No wheezing or rhonchi.  Musculoskeletal:      Cervical back: Normal range of motion and neck supple. No rigidity or tenderness.     Right lower leg: No edema.     Left lower leg: No edema.  Lymphadenopathy:     Cervical: No cervical adenopathy.  Skin:    General: Skin is warm and dry.     Coloration: Skin is not jaundiced.     Findings: Rash (large erythematous patch with significant scaling to R scalp near temporal/ parietal region) present. No bruising.  Neurological:     General: No focal deficit present.     Mental Status: She is alert and oriented to person, place, and time.  Psychiatric:        Mood and Affect: Mood normal.        Behavior: Behavior normal.      No results found for any visits on 03/23/24.  Last CBC Lab Results  Component Value Date   WBC 5.3 03/04/2023   HGB 13.8 03/04/2023   HCT 42.3 03/04/2023   MCV 86 03/04/2023   MCH 28.1 03/04/2023   RDW 13.9 03/04/2023   PLT 209 03/04/2023   Last metabolic panel Lab Results  Component Value Date   GLUCOSE 96 06/10/2023   NA 144 06/10/2023   K 4.2 06/10/2023   CL 102 06/10/2023   CO2 25 06/10/2023   BUN 9 06/10/2023   CREATININE 0.67 06/10/2023   EGFR 106 06/10/2023   CALCIUM 9.9 06/10/2023   PROT 7.3 06/10/2023   ALBUMIN 4.9 06/10/2023   LABGLOB 2.4 06/10/2023   BILITOT 0.3 06/10/2023   ALKPHOS 101 06/10/2023   AST 37 06/10/2023   ALT 64 (H) 06/10/2023   Last lipids Lab Results  Component Value Date   CHOL 200 (H) 03/04/2023   HDL 38 (L) 03/04/2023   LDLCALC 121 (H) 03/04/2023   TRIG 230 (H) 03/04/2023   CHOLHDL 5.3 (H) 03/04/2023   Last hemoglobin A1c Lab Results  Component Value Date   HGBA1C 6.4 (A) 09/16/2023   Last thyroid functions Lab Results  Component Value Date   TSH 0.763 09/16/2023   FREET4 0.99 09/16/2023   THYROIDAB 9 09/16/2023   Last vitamin D  Lab Results  Component Value Date   VD25OH 12.8 (L) 09/16/2023   Last vitamin B12 and Folate Lab Results  Component Value Date   VITAMINB12 320 09/16/2023    FOLATE 18.2 09/16/2023      The 10-year ASCVD risk score (Arnett DK, et al., 2019) is: 3.5%  Assessment & Plan:  Type 2 diabetes mellitus with hyperglycemia, without long-term current use of insulin (HCC) -     Ambulatory referral  to Ophthalmology -     Hemoglobin A1c -     Cortisol -     FreeStyle Libre 3 Sensor; 1 each by Does not apply route every 14 (fourteen) days. Please apply for 14 days and then switch to new sensor  Dispense: 2 each; Refill: 0  Vitamin D  deficiency -     Vitamin D  (Ergocalciferol ); Take 1 capsule (50,000 Units total) by mouth every 7 (seven) days.  Dispense: 12 capsule; Refill: 0  Pruritus of scalp  Myalgia -     Lyme Disease Serology w/Reflex -     MyoMarker 3 Plus Profile (RDL) -     Anti-scleroderma antibody -     DG Hand Complete Right; Future -     DG Hand Complete Left; Future  Muscle pain -     MyoMarker 3 Plus Profile (RDL) -     Anti-scleroderma antibody -     DG Hand Complete Right; Future -     DG Hand Complete Left; Future  Paresthesia -     MyoMarker 3 Plus Profile (RDL) -     CMP14+EGFR -     CBC with Differential/Platelet -     Anti-scleroderma antibody  Scalp psoriasis -     Clobetasol  Propionate; Apply 1 Application topically 2 (two) times daily.  Dispense: 50 mL; Refill: 0  Abdominal bloating -     H. pylori breath test  Need for viral immunization -     Varicella-zoster vaccine IM  Immunization due -     Tdap vaccine greater than or equal to 7yo IM    We will complete a rather extensive workup as above to try and address her continued symptoms again today. Recheck glucose. Samples of freestyle sensors where given to patient in office.  Given suspected psoriasis to scalp will also perform workup to see if psoriatic arthritis may be the cause of her continued symptoms. Topical clobetasol  unlikely to have a significant impact on overall glucose, therefore will call in for her to try. May consider once daily rather than the  usual twice.  H pylori testing today. Pt has not used antacids. Pt fasting.  Will update vaccines, will need to return in 2 months for second/ last shingrix .  I spent 45 minutes of total time managing this patient today, this includes chart review, face to face, and non-face to face time, reviewing outside records and labs and providing personal interpretation.   Return in about 6 months (around 09/20/2024).   Benton LITTIE Gave, PA     [1]  Social History Tobacco Use   Smoking status: Never   Smokeless tobacco: Never  Vaping Use   Vaping status: Never Used  Substance Use Topics   Alcohol use: Not Currently   Drug use: Never   "

## 2024-03-23 NOTE — Patient Instructions (Addendum)
 Please go to [address] for xrays.  I have obtained a further labs to assess your symptoms.  Start the clobetasol  scalp solution to your hair 1-2 times daily  Take Vit D once weekly x 12 weeks   Follow up with me in 6 months.

## 2024-03-24 ENCOUNTER — Ambulatory Visit: Payer: Self-pay | Admitting: Urgent Care

## 2024-03-24 ENCOUNTER — Encounter: Payer: Self-pay | Admitting: Urgent Care

## 2024-03-24 DIAGNOSIS — A048 Other specified bacterial intestinal infections: Secondary | ICD-10-CM

## 2024-03-24 DIAGNOSIS — E1165 Type 2 diabetes mellitus with hyperglycemia: Secondary | ICD-10-CM

## 2024-03-24 LAB — CORTISOL: Cortisol: 10.4 ug/dL (ref 6.2–19.4)

## 2024-03-24 MED ORDER — OZEMPIC (0.25 OR 0.5 MG/DOSE) 2 MG/3ML ~~LOC~~ SOPN: 0.2500 mg | PEN_INJECTOR | SUBCUTANEOUS | 2 refills | Status: AC

## 2024-03-24 MED ORDER — FREESTYLE LIBRE 3 SENSOR MISC: 1.0000 | 0 refills | Status: AC

## 2024-03-26 MED ORDER — BISMUTH/METRONIDAZ/TETRACYCLIN 140-125-125 MG PO CAPS: 3.0000 | ORAL_CAPSULE | Freq: Four times a day (QID) | ORAL | 0 refills | Status: AC

## 2024-03-26 MED ORDER — OMEPRAZOLE 20 MG PO CPDR: 20.0000 mg | DELAYED_RELEASE_CAPSULE | Freq: Two times a day (BID) | ORAL | 0 refills | Status: AC

## 2024-03-27 ENCOUNTER — Other Ambulatory Visit (HOSPITAL_COMMUNITY): Payer: Self-pay

## 2024-04-03 ENCOUNTER — Telehealth: Payer: Self-pay | Admitting: Pharmacy Technician

## 2024-04-03 ENCOUNTER — Other Ambulatory Visit (HOSPITAL_COMMUNITY): Payer: Self-pay

## 2024-04-03 NOTE — Telephone Encounter (Signed)
 Pharmacy Patient Advocate Encounter  Received notification from [employer] that Prior Authorization for Ozempic  (0.25 or 0.5 MG/DOSE) 2MG /3ML pen-injectors has been APPROVED from 03/04/2024 to 04/03/2025. Ran test claim, Copay is $50.00. This test claim was processed through [facility]- copay amounts may vary at other pharmacies due to pharmacy/plan contracts, or as the patient moves through the different stages of their insurance plan.   PA #/Case ID/Reference #: [id number]

## 2024-04-03 NOTE — Telephone Encounter (Signed)
 Pharmacy Patient Advocate Encounter   Received notification from [facility] KEY that prior authorization for Ozempic  (0.25 or 0.5 MG/DOSE) 2MG /3ML pen-injectors is required/requested.   Insurance verification completed.   The patient is insured through [employer].   Per test claim: PA required; PA submitted to above mentioned insurance via Latent Key/confirmation #/EOC [facility] Status is pending

## 2024-04-05 LAB — MYOMARKER 3 PLUS PROFILE (RDL)
Anti-EJ Ab (RDL): NEGATIVE
Anti-Jo-1 Ab (RDL): <20 U
Anti-Ku Ab (RDL): NEGATIVE
Anti-MDA-5 Ab (CADM-140)(RDL): <20 U
Anti-Mi-2 Ab (RDL): NEGATIVE
Anti-NXP-2 (P140) Ab (RDL): <20 U
Anti-OJ Ab (RDL): NEGATIVE
Anti-PL-12 Ab (RDL: NEGATIVE
Anti-PL-7 Ab (RDL): NEGATIVE
Anti-PM/Scl-100 Ab (RDL): <20 U
Anti-SAE1 Ab, IgG (RDL): <20 U
Anti-SRP Ab (RDL): NEGATIVE
Anti-SS-A 52kD Ab, IgG (RDL): <20 U
Anti-TIF-1gamma Ab (RDL): <20 U
Anti-U1 RNP Ab (RDL): <20 U
Anti-U2 RNP Ab (RDL): NEGATIVE
Anti-U3 RNP (Fibrillarin)(RDL): NEGATIVE

## 2024-04-05 LAB — CBC WITH DIFFERENTIAL/PLATELET
Basophils Absolute: 0 x10E3/uL (ref 0.0–0.2)
Basos: 1 %
EOS (ABSOLUTE): 0.1 x10E3/uL (ref 0.0–0.4)
Eos: 2 %
Hematocrit: 43.6 % (ref 34.0–46.6)
Hemoglobin: 13.7 g/dL (ref 11.1–15.9)
Immature Grans (Abs): 0 x10E3/uL (ref 0.0–0.1)
Immature Granulocytes: 0 %
Lymphocytes Absolute: 1.8 x10E3/uL (ref 0.7–3.1)
Lymphs: 31 %
MCH: 28.2 pg (ref 26.6–33.0)
MCHC: 31.4 g/dL — ABNORMAL LOW (ref 31.5–35.7)
MCV: 90 fL (ref 79–97)
Monocytes Absolute: 0.3 x10E3/uL (ref 0.1–0.9)
Monocytes: 6 %
Neutrophils Absolute: 3.6 x10E3/uL (ref 1.4–7.0)
Neutrophils: 59 %
Platelets: 152 x10E3/uL (ref 150–450)
RBC: 4.86 x10E6/uL (ref 3.77–5.28)
RDW: 13.8 % (ref 11.7–15.4)
WBC: 6 x10E3/uL (ref 3.4–10.8)

## 2024-04-05 LAB — H. PYLORI BREATH TEST: H pylori Breath Test: POSITIVE — AB

## 2024-04-05 LAB — CMP14+EGFR
ALT: 118 IU/L — ABNORMAL HIGH (ref 0–32)
AST: 60 IU/L — ABNORMAL HIGH (ref 0–40)
Albumin: 4.8 g/dL (ref 3.8–4.9)
Alkaline Phosphatase: 120 IU/L (ref 49–135)
BUN/Creatinine Ratio: 17 (ref 9–23)
BUN: 12 mg/dL (ref 6–24)
Bilirubin Total: 0.3 mg/dL (ref 0.0–1.2)
CO2: 23 mmol/L (ref 20–29)
Calcium: 9.8 mg/dL (ref 8.7–10.2)
Chloride: 99 mmol/L (ref 96–106)
Creatinine, Ser: 0.69 mg/dL (ref 0.57–1.00)
Globulin, Total: 2.5 g/dL (ref 1.5–4.5)
Glucose: 296 mg/dL — ABNORMAL HIGH (ref 70–99)
Potassium: 4.4 mmol/L (ref 3.5–5.2)
Sodium: 138 mmol/L (ref 134–144)
Total Protein: 7.3 g/dL (ref 6.0–8.5)
eGFR: 105 mL/min/1.73

## 2024-04-05 LAB — HEMOGLOBIN A1C
Est. average glucose Bld gHb Est-mCnc: 214 mg/dL
Hgb A1c MFr Bld: 9.1 % — ABNORMAL HIGH (ref 4.8–5.6)

## 2024-04-05 LAB — ANTI-SCLERODERMA ANTIBODY: Scleroderma (Scl-70) (ENA) Antibody, IgG: <0.2 AI (ref 0.0–0.9)

## 2024-04-05 LAB — LYME DISEASE SEROLOGY W/REFLEX: Lyme Total Antibody EIA: NEGATIVE

## 2024-04-21 ENCOUNTER — Other Ambulatory Visit: Payer: Self-pay

## 2024-04-21 DIAGNOSIS — A048 Other specified bacterial intestinal infections: Secondary | ICD-10-CM

## 2024-04-21 NOTE — Telephone Encounter (Signed)
 This med was prescribed for H.pylori infection. Should it be continued?

## 2024-05-17 ENCOUNTER — Encounter: Admitting: Urgent Care

## 2024-05-31 ENCOUNTER — Ambulatory Visit

## 2024-06-26 ENCOUNTER — Ambulatory Visit: Admitting: Physician Assistant

## 2024-07-03 ENCOUNTER — Ambulatory Visit: Admitting: Dermatology

## 2024-09-20 ENCOUNTER — Ambulatory Visit: Admitting: Urgent Care
